# Patient Record
Sex: Female | Born: 1995 | Race: Black or African American | Hispanic: No | Marital: Single | Smoking: Never smoker
Health system: Southern US, Community
[De-identification: ages and names within clinical notes are randomized; demographics above are authoritative.]

## PROBLEM LIST (undated history)

## (undated) DIAGNOSIS — Z789 Other specified health status: Secondary | ICD-10-CM

## (undated) DIAGNOSIS — Z3490 Encounter for supervision of normal pregnancy, unspecified, unspecified trimester: Secondary | ICD-10-CM

## (undated) HISTORY — DX: Other specified health status: Z78.9

---

## 2017-08-01 ENCOUNTER — Emergency Department (HOSPITAL_BASED_OUTPATIENT_CLINIC_OR_DEPARTMENT_OTHER)
Admission: EM | Admit: 2017-08-01 | Discharge: 2017-08-01 | Disposition: A | Discharge status: Home / Self Care | Payer: BLUE CROSS/BLUE SHIELD | Attending: Emergency Medicine | Admitting: Emergency Medicine

## 2017-08-01 ENCOUNTER — Encounter (HOSPITAL_BASED_OUTPATIENT_CLINIC_OR_DEPARTMENT_OTHER): Payer: Self-pay | Admitting: Emergency Medicine

## 2017-08-01 ENCOUNTER — Other Ambulatory Visit: Payer: Self-pay

## 2017-08-01 DIAGNOSIS — T39395A Adverse effect of other nonsteroidal anti-inflammatory drugs [NSAID], initial encounter: Secondary | ICD-10-CM | POA: Diagnosis not present

## 2017-08-01 DIAGNOSIS — R42 Dizziness and giddiness: Secondary | ICD-10-CM | POA: Diagnosis not present

## 2017-08-01 DIAGNOSIS — K2960 Other gastritis without bleeding: Secondary | ICD-10-CM | POA: Diagnosis not present

## 2017-08-01 DIAGNOSIS — R1010 Upper abdominal pain, unspecified: Secondary | ICD-10-CM | POA: Diagnosis present

## 2017-08-01 LAB — COMPREHENSIVE METABOLIC PANEL
ALBUMIN: 3.6 g/dL (ref 3.5–5.0)
ALK PHOS: 57 U/L (ref 38–126)
ALT: 10 U/L (ref 0–44)
AST: 16 U/L (ref 15–41)
Anion gap: 7 (ref 5–15)
BILIRUBIN TOTAL: 1.9 mg/dL — AB (ref 0.3–1.2)
BUN: 18 mg/dL (ref 6–20)
CALCIUM: 8.9 mg/dL (ref 8.9–10.3)
CO2: 27 mmol/L (ref 22–32)
Chloride: 106 mmol/L (ref 98–111)
Creatinine, Ser: 0.84 mg/dL (ref 0.44–1.00)
GFR calc Af Amer: >60 mL/min (ref >60)
GFR calc non Af Amer: >60 mL/min (ref >60)
GLUCOSE: 89 mg/dL (ref 70–99)
POTASSIUM: 4 mmol/L (ref 3.5–5.1)
SODIUM: 140 mmol/L (ref 135–145)
Total Protein: 7 g/dL (ref 6.5–8.1)

## 2017-08-01 LAB — CBC WITH DIFFERENTIAL/PLATELET
BASOS ABS: 0 10*3/uL (ref 0.0–0.1)
Basophils Relative: 0 %
EOS ABS: 0.2 10*3/uL (ref 0.0–0.7)
Eosinophils Relative: 3 %
HEMATOCRIT: 32.1 % — AB (ref 36.0–46.0)
HEMOGLOBIN: 10.9 g/dL — AB (ref 12.0–15.0)
Lymphocytes Relative: 24 %
Lymphs Abs: 1.8 10*3/uL (ref 0.7–4.0)
MCH: 32.5 pg (ref 26.0–34.0)
MCHC: 34 g/dL (ref 30.0–36.0)
MCV: 95.8 fL (ref 78.0–100.0)
MONOS PCT: 7 %
Monocytes Absolute: 0.5 10*3/uL (ref 0.1–1.0)
NEUTROS ABS: 4.9 10*3/uL (ref 1.7–7.7)
NEUTROS PCT: 66 %
Platelets: 313 10*3/uL (ref 150–400)
RBC: 3.35 MIL/uL — ABNORMAL LOW (ref 3.87–5.11)
RDW: 10.2 % — ABNORMAL LOW (ref 11.5–15.5)
WBC: 7.4 10*3/uL (ref 4.0–10.5)

## 2017-08-01 LAB — URINALYSIS, ROUTINE W REFLEX MICROSCOPIC
Bilirubin Urine: NEGATIVE
Glucose, UA: NEGATIVE mg/dL
Hgb urine dipstick: NEGATIVE
KETONES UR: NEGATIVE mg/dL
Leukocytes, UA: NEGATIVE
NITRITE: NEGATIVE
Protein, ur: NEGATIVE mg/dL
Specific Gravity, Urine: <1.005 — ABNORMAL LOW (ref 1.005–1.030)
pH: 7 (ref 5.0–8.0)

## 2017-08-01 LAB — LIPASE, BLOOD: Lipase: 26 U/L (ref 11–51)

## 2017-08-01 LAB — PREGNANCY, URINE: PREG TEST UR: NEGATIVE

## 2017-08-01 MED ORDER — GI COCKTAIL ~~LOC~~
30.0000 mL | Freq: Once | ORAL | Status: AC
  Administered 2017-08-01: 30 mL via ORAL
  Filled 2017-08-01: qty 30

## 2017-08-01 MED ORDER — ONDANSETRON HCL 4 MG PO TABS: 4.0000 mg | ORAL_TABLET | Freq: Four times a day (QID) | ORAL | 0 refills | Status: DC | PRN

## 2017-08-01 MED ORDER — FAMOTIDINE 20 MG PO TABS: 20.0000 mg | ORAL_TABLET | Freq: Two times a day (BID) | ORAL | 0 refills | Status: DC

## 2017-08-01 NOTE — ED Provider Notes (Signed)
[facility] EMERGENCY DEPARTMENT Provider Note   CSN: [id number] Arrival date & time: 08/01/17  [id number]     History   Chief Complaint Chief Complaint  Patient presents with  . Nausea    HPI Cynthia Avery is a 22 y.o. female.  HPI Patient has been taking meloxicam for the past month for a hamstring injury.  Over the past week she is developed upper abdominal pain and nausea.  She also is experiencing lightheadedness especially with standing.  She denies vomiting.  No grossly bloody or melanotic stools.  No fever or chills.  No urinary symptoms.  Denies focal weakness or numbness. History reviewed. No pertinent past medical history.  There are no active problems to display for this patient.   History reviewed. No pertinent surgical history.   OB History   None      Home Medications    Prior to Admission medications   Medication Sig Start Date End Date Taking? Authorizing Provider  famotidine (PEPCID) 20 MG tablet Take 1 tablet (20 mg total) by mouth 2 (two) times daily. 08/01/17   Loren RacerYelverton, Malaijah Houchen, MD  ondansetron (ZOFRAN) 4 MG tablet Take 1 tablet (4 mg total) by mouth every 6 (six) hours as needed for nausea or vomiting. 08/01/17   Loren RacerYelverton, Willson Lipa, MD    Family History No family history on file.  Social History Social History   Tobacco Use  . Smoking status: Never Smoker  . Smokeless tobacco: Never Used  Substance Use Topics  . Alcohol use: Yes  . Drug use: Never     Allergies   Patient has no known allergies.   Review of Systems Review of Systems  Constitutional: Negative for chills and fever.  Eyes: Negative for visual disturbance.  Respiratory: Negative for cough and shortness of breath.   Cardiovascular: Negative for chest pain, palpitations and leg swelling.  Gastrointestinal: Positive for abdominal pain and nausea. Negative for blood in stool, constipation, diarrhea and vomiting.  Genitourinary: Negative for dysuria, flank pain and  hematuria.  Musculoskeletal: Negative for back pain, myalgias and neck pain.  Skin: Negative for rash and wound.  Neurological: Positive for light-headedness. Negative for syncope, weakness, numbness and headaches.  All other systems reviewed and are negative.    Physical Exam Updated Vital Signs BP 96/84   Pulse 60   Temp 98.3 F (36.8 C) (Oral)   Resp 16   Ht 5\' 4"  (1.626 m)   Wt 59 kg (130 lb)   LMP 07/27/2017   SpO2 99%   BMI 22.31 kg/m   Physical Exam  Constitutional: She is oriented to person, place, and time. She appears well-developed and well-nourished. No distress.  HENT:  Head: Normocephalic and atraumatic.  Mouth/Throat: Oropharynx is clear and moist. No oropharyngeal exudate.  Eyes: Pupils are equal, round, and reactive to light. EOM are normal.  Neck: Normal range of motion. Neck supple.  Cardiovascular: Normal rate and regular rhythm. Exam reveals no gallop and no friction rub.  No murmur heard. Pulmonary/Chest: Effort normal and breath sounds normal. No stridor. No respiratory distress. She has no wheezes. She has no rales. She exhibits no tenderness.  Abdominal: Soft. Bowel sounds are normal. There is tenderness. There is no rebound and no guarding.  Patient with epigastric and left upper quadrant tenderness to palpation.  No rebound or guarding.  Musculoskeletal: Normal range of motion. She exhibits no edema or tenderness.  No midline thoracic or lumbar tenderness.  No CVA tenderness.  No lower extremity swelling,  asymmetry or tenderness.  Distal pulses are 2+.  Neurological: She is alert and oriented to person, place, and time.  Moves all extremities without focal deficit.  Sensation intact.  Ambulance without difficulty.  Skin: Skin is warm and dry. Capillary refill takes less than 2 seconds. No rash noted. She is not diaphoretic. No erythema.  Psychiatric: She has a normal mood and affect. Her behavior is normal.  Nursing note and vitals  reviewed.    ED Treatments / Results  Labs (all labs ordered are listed, but only abnormal results are displayed) Labs Reviewed  URINALYSIS, ROUTINE W REFLEX MICROSCOPIC - Abnormal; Notable for the following components:      Result Value   Specific Gravity, Urine <1.005 (*)    All other components within normal limits  CBC WITH DIFFERENTIAL/PLATELET - Abnormal; Notable for the following components:   RBC 3.35 (*)    Hemoglobin 10.9 (*)    HCT 32.1 (*)    RDW 10.2 (*)    All other components within normal limits  COMPREHENSIVE METABOLIC PANEL - Abnormal; Notable for the following components:   Total Bilirubin 1.9 (*)    All other components within normal limits  PREGNANCY, URINE  LIPASE, BLOOD    EKG EKG Interpretation  Date/Time:  Thursday August 01 2017 10:07:21 EDT Ventricular Rate:  60 PR Interval:    QRS Duration: 97 QT Interval:  443 QTC Calculation: 443 R Axis:   64 Text Interpretation:  Sinus rhythm Confirmed by Loren Racer ([id number]) on 08/01/2017 11:14:26 AM   Radiology No results found.  Procedures Procedures (including critical care time)  Medications Ordered in ED Medications  gi cocktail (Maalox,Lidocaine,Donnatal) (30 mLs Oral Given 08/01/17 0955)     Initial Impression / Assessment and Plan / ED Course  I have reviewed the triage vital signs and the nursing notes.  Pertinent labs & imaging results that were available during my care of the patient were reviewed by me and considered in my medical decision making (see chart for details).    Abdominal pain is improved after GI cocktail.  Labs are reassuring.  Likely NSAID induced gastritis.  Advised to avoid meloxicam and all NSAIDs.  May take Tylenol as needed for pain.  Will start on Pepcid.  Advised to follow-up with her gastroneurologist if her symptoms persist.  Return precautions given.   Final Clinical Impressions(s) / ED Diagnoses   Final diagnoses:  NSAID induced gastritis   Lightheadedness    ED Discharge Orders        Ordered    famotidine (PEPCID) 20 MG tablet  2 times daily     08/01/17 1113    ondansetron (ZOFRAN) 4 MG tablet  Every 6 hours PRN     08/01/17 1113       Loren Racer, MD 08/01/17 1114

## 2017-08-01 NOTE — ED Triage Notes (Signed)
Pt c/o nausea x 1 week, denies vomiting. Reports mild dizziness. States possibly related to starting meloxicam for hamstring injury.

## 2017-08-01 NOTE — Discharge Instructions (Addendum)
Stop taking meloxicam.  Avoid all NSAIDs.  Take medication as prescribed.  If your symptoms persist, follow-up with the gastroenterologist.

## 2020-06-07 DIAGNOSIS — B009 Herpesviral infection, unspecified: Secondary | ICD-10-CM

## 2020-07-21 ENCOUNTER — Encounter: Payer: Self-pay | Admitting: Family Medicine

## 2020-07-21 ENCOUNTER — Ambulatory Visit (INDEPENDENT_AMBULATORY_CARE_PROVIDER_SITE_OTHER): Payer: 59 | Admitting: Family Medicine

## 2020-07-21 ENCOUNTER — Other Ambulatory Visit: Payer: Self-pay

## 2020-07-21 NOTE — Progress Notes (Signed)
    Post Partum Visit Note  Cynthia Avery is a 25 y.o. No obstetric history on file. female who presents for a postpartum visit. She is 6 week postpartum following a normal spontaneous vaginal delivery.  I have fully reviewed the prenatal and intrapartum course. The delivery was at 40.5 gestational weeks.  Anesthesia: none. Postpartum course has been uncomplicated. Baby is doing well. Baby is feeding by breast. Bleeding no bleeding. Bowel function is normal. Bladder function is normal. Patient is not sexually active. Contraception method is none. Postpartum depression screening: negative.   The pregnancy intention screening data noted above was reviewed. Potential methods of contraception were discussed. The patient elected to proceed with No data recorded.    Health Maintenance Due  Topic Date Due   COVID-19 Vaccine (1) Never done   HPV VACCINES (1 - 2-dose series) Never done   HIV Screening  Never done   Hepatitis C Screening  Never done   TETANUS/TDAP  Never done   PAP-Cervical Cytology Screening  Never done   PAP SMEAR-Modifier  Never done    The following portions of the patient's history were reviewed and updated as appropriate: allergies, current medications, past family history, past medical history, past social history, past surgical history, and problem list.  Review of Systems Pertinent items are noted in HPI.  Objective:  BP 106/70   Pulse 72   Wt 120 lb (54.4 kg)   BMI 20.60 kg/m    General:  alert, cooperative, and no distress  Lungs: clear to auscultation bilaterally  Heart:  regular rate and rhythm, S1, S2 normal, no murmur, click, rub or gallop  Abdomen: soft, non-tender; bowel sounds normal; no masses,  no organomegaly   GU exam:  normal       Assessment:   1. Postpartum exam      Plan:   Essential components of care per ACOG recommendations:  1.  Mood and well being: Patient with negative depression screening today. Reviewed local resources for  support.  - Patient tobacco use? No.   - hx of drug use? No.    2. Infant care and feeding:  -Patient currently breastmilk feeding?  yes -Social determinants of health (SDOH) reviewed in EPIC. No concerns  3. Sexuality, contraception and birth spacing - Patient does not want a pregnancy in the next year.  Desired family size is 1 children.  - Reviewed forms of contraception in tiered fashion. Patient desired no method today.   - Discussed birth spacing of 18 months  4. Sleep and fatigue -Encouraged family/partner/community support of 4 hrs of uninterrupted sleep to help with mood and fatigue  5. Physical Recovery  - Discussed patients delivery and complications. She describes her labor as good. - Patient had a Vaginal, no problems at delivery. Patient had a 1st degree laceration. Perineal healing reviewed. Patient expressed understanding - Patient has urinary incontinence? No. - Patient is safe to resume physical and sexual activity  6.  Health Maintenance - HM due items addressed  - Last pap smear 10/2019 Pap smear not done at today's visit.  -Breast Cancer screening indicated? No.   7. Chronic Disease/Pregnancy Condition follow up: None  - PCP follow up  Levie Heritage, DO Center for [facility], [facility]

## 2020-07-26 ENCOUNTER — Emergency Department (HOSPITAL_BASED_OUTPATIENT_CLINIC_OR_DEPARTMENT_OTHER): Payer: 59

## 2020-07-26 ENCOUNTER — Encounter (HOSPITAL_BASED_OUTPATIENT_CLINIC_OR_DEPARTMENT_OTHER): Payer: Self-pay

## 2020-07-26 ENCOUNTER — Other Ambulatory Visit: Payer: Self-pay

## 2020-07-26 ENCOUNTER — Emergency Department (HOSPITAL_BASED_OUTPATIENT_CLINIC_OR_DEPARTMENT_OTHER)
Admission: EM | Admit: 2020-07-26 | Discharge: 2020-07-26 | Disposition: A | Discharge status: Home / Self Care | Payer: 59 | Attending: Emergency Medicine | Admitting: Emergency Medicine

## 2020-07-26 DIAGNOSIS — R079 Chest pain, unspecified: Secondary | ICD-10-CM

## 2020-07-26 DIAGNOSIS — R072 Precordial pain: Secondary | ICD-10-CM | POA: Diagnosis present

## 2020-07-26 DIAGNOSIS — K219 Gastro-esophageal reflux disease without esophagitis: Secondary | ICD-10-CM

## 2020-07-26 LAB — TROPONIN I (HIGH SENSITIVITY)
Troponin I (High Sensitivity): <2 ng/L (ref <18)
Troponin I (High Sensitivity): <2 ng/L (ref <18)

## 2020-07-26 LAB — CBC
HCT: 37.1 % (ref 36.0–46.0)
Hemoglobin: 12.2 g/dL (ref 12.0–15.0)
MCH: 31.5 pg (ref 26.0–34.0)
MCHC: 32.9 g/dL (ref 30.0–36.0)
MCV: 95.9 fL (ref 80.0–100.0)
Platelets: 285 10*3/uL (ref 150–400)
RBC: 3.87 MIL/uL (ref 3.87–5.11)
RDW: 11 % — ABNORMAL LOW (ref 11.5–15.5)
WBC: 5.2 10*3/uL (ref 4.0–10.5)
nRBC: 0 % (ref 0.0–0.2)

## 2020-07-26 LAB — BASIC METABOLIC PANEL
Anion gap: 11 (ref 5–15)
BUN: 11 mg/dL (ref 6–20)
CO2: 27 mmol/L (ref 22–32)
Calcium: 10.1 mg/dL (ref 8.9–10.3)
Chloride: 102 mmol/L (ref 98–111)
Creatinine, Ser: 0.92 mg/dL (ref 0.44–1.00)
GFR, Estimated: >60 mL/min (ref >60)
Glucose, Bld: 77 mg/dL (ref 70–99)
Potassium: 3.5 mmol/L (ref 3.5–5.1)
Sodium: 140 mmol/L (ref 135–145)

## 2020-07-26 LAB — PREGNANCY, URINE: Preg Test, Ur: NEGATIVE

## 2020-07-26 LAB — D-DIMER, QUANTITATIVE: D-Dimer, Quant: 0.48 ug/mL-FEU (ref 0.00–0.50)

## 2020-07-26 MED ORDER — FAMOTIDINE 20 MG PO TABS: 20.0000 mg | ORAL_TABLET | Freq: Two times a day (BID) | ORAL | 0 refills | Status: DC

## 2020-07-26 NOTE — ED Triage Notes (Addendum)
Pt c/o CP 2 weeks ago that lasted for ~5 min-CP again x ~10 min last night-denies CP at present-states that tums relieved pain last night-denies fever/flu sx-NAD-steady gait

## 2020-07-26 NOTE — Discharge Instructions (Addendum)
Your workup was overall reassuring at this time. Your symptoms may be related to acid reflux. Please pick up medication and take daily.   Follow up with your PCP regarding ED visit today  Return to the ED for any new/worsening symptoms

## 2020-07-26 NOTE — ED Provider Notes (Signed)
[facility] EMERGENCY DEPARTMENT Provider Note   CSN: [id number] Arrival date & time: 07/26/20  1130     History Chief Complaint  Patient presents with   Chest Pain    Cynthia Avery is a 25 y.o. female who presents to the ED today with complaint of sudden onset, intermittent, substernal chest pain x 2 weeks.  Patient reports that 2 weeks ago she was woken up out of her sleep around 2 AM with substernal chest pain/heaviness.  She states that it lasted approximately 5 minutes before dissipating.  She did not think much of it however last night she had similar symptoms.  She states that both times she ate several hours prior to this happening.  She did used to take famotidine for acid reflux however has not taken it in several years.  She states this feels different.  She denies any shortness of breath, diaphoresis, nausea, vomiting with same.  She notes that she just gave birth to her child approximately 6 weeks ago.  She is not on oral birth control pills at this time.  She denies any family history of CAD or sudden cardiac death.  She states otherwise she is a healthy female who regularly exercises and has never had chest pain with same.  She vapes, never smoker.  No other complaints at this time.  The history is provided by the patient and medical records.      History reviewed. No pertinent past medical history.  There are no problems to display for this patient.   History reviewed. No pertinent surgical history.   OB History   No obstetric history on file.     No family history on file.  Social History   Tobacco Use   Smoking status: Never   Smokeless tobacco: Never  Vaping Use   Vaping Use: Every day  Substance Use Topics   Alcohol use: Yes    Comment: occ   Drug use: Never    Home Medications Prior to Admission medications   Medication Sig Start Date End Date Taking? Authorizing Provider  famotidine (PEPCID) 20 MG tablet Take 1 tablet (20 mg total) by  mouth 2 (two) times daily. 07/26/20 08/25/20  Tanda Rockers, PA-C  ondansetron (ZOFRAN) 4 MG tablet Take 1 tablet (4 mg total) by mouth every 6 (six) hours as needed for nausea or vomiting. 08/01/17   Loren Racer, MD    Allergies    Patient has no known allergies.  Review of Systems   Review of Systems  Constitutional:  Negative for chills and fever.  Respiratory:  Negative for shortness of breath.   Cardiovascular:  Positive for chest pain.  Gastrointestinal:  Negative for nausea and vomiting.  All other systems reviewed and are negative.  Physical Exam Updated Vital Signs BP 114/76   Pulse 60   Temp 98.3 F (36.8 C) (Oral)   Resp (!) 23   Ht 5\' 4"  (1.626 m)   Wt 54.4 kg   LMP 07/20/2020 (Approximate)   SpO2 100%   BMI 20.60 kg/m   Physical Exam Vitals and nursing note reviewed.  Constitutional:      Appearance: She is not ill-appearing or diaphoretic.  HENT:     Head: Normocephalic and atraumatic.  Eyes:     Conjunctiva/sclera: Conjunctivae normal.  Cardiovascular:     Rate and Rhythm: Normal rate and regular rhythm.     Pulses:          Radial pulses are 2+ on the right  side and 2+ on the left side.     Heart sounds: Normal heart sounds.  Pulmonary:     Effort: Pulmonary effort is normal.     Breath sounds: Normal breath sounds. No decreased breath sounds, wheezing or rales.  Chest:     Chest wall: No tenderness.  Abdominal:     Palpations: Abdomen is soft.     Tenderness: There is no abdominal tenderness. There is no guarding or rebound.  Musculoskeletal:     Cervical back: Neck supple.  Skin:    General: Skin is warm and dry.  Neurological:     Mental Status: She is alert.    ED Results / Procedures / Treatments   Labs (all labs ordered are listed, but only abnormal results are displayed) Labs Reviewed  CBC - Abnormal; Notable for the following components:      Result Value   RDW 11.0 (*)    All other components within normal limits  BASIC  METABOLIC PANEL  PREGNANCY, URINE  D-DIMER, QUANTITATIVE  TROPONIN I (HIGH SENSITIVITY)  TROPONIN I (HIGH SENSITIVITY)    EKG EKG Interpretation  Date/Time:  Tuesday July 26 2020 11:38:21 EDT Ventricular Rate:  71 PR Interval:  112 QRS Duration: 72 QT Interval:  392 QTC Calculation: 425 R Axis:   61 Text Interpretation: Normal sinus rhythm Normal ECG  Nonspecific ECG changes Confirmed by Alvira Monday ([id number]) on 07/26/2020 1:38:55 PM  Radiology DG Chest 2 View  Result Date: 07/26/2020 CLINICAL DATA:  Chest pain. EXAM: CHEST - 2 VIEW COMPARISON:  None. FINDINGS: The heart size and mediastinal contours are within normal limits. Both lungs are clear. The visualized skeletal structures are unremarkable. IMPRESSION: No active cardiopulmonary disease. Electronically Signed   By: Kennith Center M.D.   On: 07/26/2020 12:42    Procedures Procedures   Medications Ordered in ED Medications - No data to display  ED Course  I have reviewed the triage vital signs and the nursing notes.  Pertinent labs & imaging results that were available during my care of the patient were reviewed by me and considered in my medical decision making (see chart for details).  Clinical Course as of 07/26/20 1455  Tue Jul 26, 2020  1429 D-Dimer, Quant: 0.48 [MV]    Clinical Course User Index [MV] Tanda Rockers, PA-C   MDM Rules/Calculators/A&P                          25 year old female who presents to the ED today with complaint of 2 episodes of sudden onset substernal chest pain that began 2 weeks ago.  Had 1 episode 2 weeks ago and another episode yesterday.  No other associated symptoms.  She did take Tums last night which relieved her symptoms.  She used to take famotidine in the past however has not taken in a couple of years and states this feels different than acid reflux.  She did just give birth to her child 6 weeks ago.  No other complications.  On arrival to the ED today vitals are stable.   Patient appears to be no acute distress.  No active chest pain.  EKG does show some nonspecific changes.  Chest x-ray clear.  Initial work-up started with troponin less than 2.  Suspicion for ACS at this time.  Patient does vape, never smoker and denies any family history of CAD or sudden cardiac death.  There is concern given patient just had a child 6  weeks ago.  Question PE.  We will plan for D-dimer at this time.  Question also acid reflux.  Given patient is not having any chest pain at this time will hold off on treatment  D dimer negative Repeat troponin <2 Suspect GERD. Will discharge with Rx famotidine and PCP follow up. Stable for discharge at this time.   This note was prepared using Dragon voice recognition software and may include unintentional dictation errors due to the inherent limitations of voice recognition software.    Final Clinical Impression(s) / ED Diagnoses Final diagnoses:  Nonspecific chest pain  Gastroesophageal reflux disease without esophagitis    Rx / DC Orders ED Discharge Orders          Ordered    famotidine (PEPCID) 20 MG tablet  2 times daily        07/26/20 1455             Discharge Instructions      Your workup was overall reassuring at this time. Your symptoms may be related to acid reflux. Please pick up medication and take daily.   Follow up with your PCP regarding ED visit today  Return to the ED for any new/worsening symptoms       Tanda Rockers, PA-C 07/26/20 1455    Alvira Monday, MD 07/27/20 2212

## 2020-08-26 ENCOUNTER — Encounter (HOSPITAL_BASED_OUTPATIENT_CLINIC_OR_DEPARTMENT_OTHER): Payer: Self-pay | Admitting: *Deleted

## 2020-08-26 ENCOUNTER — Inpatient Hospital Stay (HOSPITAL_BASED_OUTPATIENT_CLINIC_OR_DEPARTMENT_OTHER)
Admission: EM | Admit: 2020-08-26 | Discharge: 2020-08-31 | DRG: 417 | Disposition: A | Discharge status: Home / Self Care | Payer: 59 | Attending: Internal Medicine | Admitting: Internal Medicine

## 2020-08-26 ENCOUNTER — Other Ambulatory Visit: Payer: Self-pay

## 2020-08-26 ENCOUNTER — Emergency Department (HOSPITAL_BASED_OUTPATIENT_CLINIC_OR_DEPARTMENT_OTHER): Payer: 59

## 2020-08-26 DIAGNOSIS — R7989 Other specified abnormal findings of blood chemistry: Secondary | ICD-10-CM

## 2020-08-26 DIAGNOSIS — Z20822 Contact with and (suspected) exposure to covid-19: Secondary | ICD-10-CM | POA: Diagnosis present

## 2020-08-26 DIAGNOSIS — K859 Acute pancreatitis without necrosis or infection, unspecified: Secondary | ICD-10-CM | POA: Diagnosis present

## 2020-08-26 DIAGNOSIS — E876 Hypokalemia: Secondary | ICD-10-CM | POA: Diagnosis present

## 2020-08-26 DIAGNOSIS — K8050 Calculus of bile duct without cholangitis or cholecystitis without obstruction: Secondary | ICD-10-CM

## 2020-08-26 DIAGNOSIS — K8070 Calculus of gallbladder and bile duct without cholecystitis without obstruction: Secondary | ICD-10-CM | POA: Diagnosis not present

## 2020-08-26 DIAGNOSIS — R1011 Right upper quadrant pain: Secondary | ICD-10-CM

## 2020-08-26 DIAGNOSIS — K828 Other specified diseases of gallbladder: Secondary | ICD-10-CM | POA: Diagnosis present

## 2020-08-26 DIAGNOSIS — R17 Unspecified jaundice: Secondary | ICD-10-CM

## 2020-08-26 DIAGNOSIS — R188 Other ascites: Secondary | ICD-10-CM | POA: Diagnosis present

## 2020-08-26 DIAGNOSIS — K219 Gastro-esophageal reflux disease without esophagitis: Secondary | ICD-10-CM | POA: Diagnosis present

## 2020-08-26 DIAGNOSIS — R109 Unspecified abdominal pain: Secondary | ICD-10-CM | POA: Diagnosis present

## 2020-08-26 DIAGNOSIS — K5900 Constipation, unspecified: Secondary | ICD-10-CM | POA: Diagnosis present

## 2020-08-26 HISTORY — DX: Encounter for supervision of normal pregnancy, unspecified, unspecified trimester: Z34.90

## 2020-08-26 LAB — TROPONIN I (HIGH SENSITIVITY): Troponin I (High Sensitivity): <2 ng/L (ref <18)

## 2020-08-26 LAB — COMPREHENSIVE METABOLIC PANEL
ALT: 76 U/L — ABNORMAL HIGH (ref 0–44)
AST: 219 U/L — ABNORMAL HIGH (ref 15–41)
Albumin: 4.4 g/dL (ref 3.5–5.0)
Alkaline Phosphatase: 86 U/L (ref 38–126)
Anion gap: 8 (ref 5–15)
BUN: 13 mg/dL (ref 6–20)
CO2: 28 mmol/L (ref 22–32)
Calcium: 9.2 mg/dL (ref 8.9–10.3)
Chloride: 101 mmol/L (ref 98–111)
Creatinine, Ser: 0.8 mg/dL (ref 0.44–1.00)
GFR, Estimated: >60 mL/min (ref >60)
Glucose, Bld: 92 mg/dL (ref 70–99)
Potassium: 3.4 mmol/L — ABNORMAL LOW (ref 3.5–5.1)
Sodium: 137 mmol/L (ref 135–145)
Total Bilirubin: 3.8 mg/dL — ABNORMAL HIGH (ref 0.3–1.2)
Total Protein: 8 g/dL (ref 6.5–8.1)

## 2020-08-26 LAB — CBC WITH DIFFERENTIAL/PLATELET
Abs Immature Granulocytes: 0.04 10*3/uL (ref 0.00–0.07)
Basophils Absolute: 0 10*3/uL (ref 0.0–0.1)
Basophils Relative: 1 %
Eosinophils Absolute: 0 10*3/uL (ref 0.0–0.5)
Eosinophils Relative: 0 %
HCT: 36.9 % (ref 36.0–46.0)
Hemoglobin: 12.2 g/dL (ref 12.0–15.0)
Immature Granulocytes: 1 %
Lymphocytes Relative: 8 %
Lymphs Abs: 0.7 10*3/uL (ref 0.7–4.0)
MCH: 31.6 pg (ref 26.0–34.0)
MCHC: 33.1 g/dL (ref 30.0–36.0)
MCV: 95.6 fL (ref 80.0–100.0)
Monocytes Absolute: 0.6 10*3/uL (ref 0.1–1.0)
Monocytes Relative: 7 %
Neutro Abs: 7.4 10*3/uL (ref 1.7–7.7)
Neutrophils Relative %: 83 %
Platelets: 331 10*3/uL (ref 150–400)
RBC: 3.86 MIL/uL — ABNORMAL LOW (ref 3.87–5.11)
RDW: 11 % — ABNORMAL LOW (ref 11.5–15.5)
WBC: 8.9 10*3/uL (ref 4.0–10.5)
nRBC: 0 % (ref 0.0–0.2)

## 2020-08-26 LAB — LIPASE, BLOOD: Lipase: 36 U/L (ref 11–51)

## 2020-08-26 NOTE — ED Triage Notes (Signed)
Epigastric pain into her back for an hour. States she has been here before for same and told her has GERD.

## 2020-08-26 NOTE — ED Provider Notes (Addendum)
[facility] EMERGENCY DEPARTMENT Provider Note   CSN: [id number] Arrival date & time: 08/26/20  2148     History Chief Complaint  Patient presents with   Chest Pain    Cynthia Avery is a 25 y.o. female.  The history is provided by the patient.  Chest Pain Chest pain location: RUQ and epigastric area. Pain quality: aching   Pain radiates to:  Does not radiate Pain severity:  Moderate Onset quality:  Sudden Duration: hours. Timing:  Constant Progression:  Unchanged Chronicity:  New Context: at rest   Context comment:  After eating soup and a bag of Doritos Relieved by:  Nothing Worsened by:  Nothing Ineffective treatments:  None tried Associated symptoms: abdominal pain   Associated symptoms: no fever, no lower extremity edema and no shortness of breath   Risk factors: not obese       History reviewed. No pertinent past medical history.  There are no problems to display for this patient.   History reviewed. No pertinent surgical history.   OB History   No obstetric history on file.     History reviewed. No pertinent family history.  Social History   Tobacco Use   Smoking status: Never   Smokeless tobacco: Never  Vaping Use   Vaping Use: Every day  Substance Use Topics   Alcohol use: Yes    Comment: occ   Drug use: Never    Home Medications Prior to Admission medications   Medication Sig Start Date End Date Taking? Authorizing Provider  famotidine (PEPCID) 20 MG tablet Take 1 tablet (20 mg total) by mouth 2 (two) times daily. 07/26/20 08/25/20  Tanda Rockers, PA-C  ondansetron (ZOFRAN) 4 MG tablet Take 1 tablet (4 mg total) by mouth every 6 (six) hours as needed for nausea or vomiting. 08/01/17   Loren Racer, MD    Allergies    Patient has no known allergies.  Review of Systems   Review of Systems  Constitutional:  Negative for fever.  HENT:  Negative for facial swelling.   Eyes:  Negative for redness.  Respiratory:  Negative for  shortness of breath.   Cardiovascular:  Positive for chest pain.  Gastrointestinal:  Positive for abdominal pain.  Genitourinary:  Negative for difficulty urinating.  Musculoskeletal:  Negative for neck stiffness.  Skin:  Negative for rash.  Neurological:  Negative for facial asymmetry.  Psychiatric/Behavioral:  Negative for agitation.   All other systems reviewed and are negative.  Physical Exam Updated Vital Signs BP 104/64 (BP Location: Right Arm)   Pulse 80   Temp 98.3 F (36.8 C) (Oral)   Resp 20   Ht 5\' 4"  (1.626 m)   Wt 54.4 kg   LMP 07/16/2020 Comment: recent preg, gave birth 06/07/20  SpO2 100%   BMI 20.59 kg/m   Physical Exam Vitals and nursing note reviewed.  Constitutional:      General: She is not in acute distress.    Appearance: Normal appearance.  HENT:     Head: Normocephalic and atraumatic.     Nose: Nose normal.  Eyes:     Conjunctiva/sclera: Conjunctivae normal.     Pupils: Pupils are equal, round, and reactive to light.  Cardiovascular:     Rate and Rhythm: Normal rate and regular rhythm.     Pulses: Normal pulses.     Heart sounds: Normal heart sounds.  Pulmonary:     Effort: Pulmonary effort is normal.     Breath sounds: Normal breath sounds.  Abdominal:     General: Abdomen is flat.     Palpations: Abdomen is soft.     Tenderness: There is abdominal tenderness. There is no rebound.     Comments: RUQ   Musculoskeletal:        General: Normal range of motion.     Cervical back: Normal range of motion and neck supple.  Skin:    General: Skin is warm and dry.     Capillary Refill: Capillary refill takes less than 2 seconds.  Neurological:     General: No focal deficit present.     Mental Status: She is alert and oriented to person, place, and time.  Psychiatric:        Mood and Affect: Mood normal.        Behavior: Behavior normal.    ED Results / Procedures / Treatments   Labs (all labs ordered are listed, but only abnormal results  are displayed) Results for orders placed or performed during the hospital encounter of 08/26/20  CBC with Differential/Platelet  Result Value Ref Range   WBC 8.9 4.0 - 10.5 K/uL   RBC 3.86 (L) 3.87 - 5.11 MIL/uL   Hemoglobin 12.2 12.0 - 15.0 g/dL   HCT 31.5 17.6 - 16.0 %   MCV 95.6 80.0 - 100.0 fL   MCH 31.6 26.0 - 34.0 pg   MCHC 33.1 30.0 - 36.0 g/dL   RDW 73.7 (L) 10.6 - 26.9 %   Platelets 331 150 - 400 K/uL   nRBC 0.0 0.0 - 0.2 %   Neutrophils Relative % 83 %   Neutro Abs 7.4 1.7 - 7.7 K/uL   Lymphocytes Relative 8 %   Lymphs Abs 0.7 0.7 - 4.0 K/uL   Monocytes Relative 7 %   Monocytes Absolute 0.6 0.1 - 1.0 K/uL   Eosinophils Relative 0 %   Eosinophils Absolute 0.0 0.0 - 0.5 K/uL   Basophils Relative 1 %   Basophils Absolute 0.0 0.0 - 0.1 K/uL   Immature Granulocytes 1 %   Abs Immature Granulocytes 0.04 0.00 - 0.07 K/uL  Comprehensive metabolic panel  Result Value Ref Range   Sodium 137 135 - 145 mmol/L   Potassium 3.4 (L) 3.5 - 5.1 mmol/L   Chloride 101 98 - 111 mmol/L   CO2 28 22 - 32 mmol/L   Glucose, Bld 92 70 - 99 mg/dL   BUN 13 6 - 20 mg/dL   Creatinine, Ser 4.85 0.44 - 1.00 mg/dL   Calcium 9.2 8.9 - 46.2 mg/dL   Total Protein 8.0 6.5 - 8.1 g/dL   Albumin 4.4 3.5 - 5.0 g/dL   AST 703 (H) 15 - 41 U/L   ALT 76 (H) 0 - 44 U/L   Alkaline Phosphatase 86 38 - 126 U/L   Total Bilirubin 3.8 (H) 0.3 - 1.2 mg/dL   GFR, Estimated >50 >09 mL/min   Anion gap 8 5 - 15  Lipase, blood  Result Value Ref Range   Lipase 36 11 - 51 U/L  Troponin I (High Sensitivity)  Result Value Ref Range   Troponin I (High Sensitivity) <2 <18 ng/L   DG Chest 2 View  Result Date: 08/26/2020 CLINICAL DATA:  Chest pain EXAM: CHEST - 2 VIEW COMPARISON:  None. FINDINGS: The heart size and mediastinal contours are within normal limits. Both lungs are clear. The visualized skeletal structures are unremarkable. IMPRESSION: No active cardiopulmonary disease. Electronically Signed   By: Helyn Numbers M.D.   On:  08/26/2020 22:47    EKG EKG Interpretation  Date/Time:  Friday August 26 2020 22:11:05 EDT Ventricular Rate:  77 PR Interval:  128 QRS Duration: 74 QT Interval:  386 QTC Calculation: 436 R Axis:   72 Text Interpretation: Normal sinus rhythm Nonspecific T wave abnormality no change Confirmed by Nicanor Alcon, Sevrin Sally ([id number]) on 08/26/2020 11:19:36 PM  Radiology DG Chest 2 View  Result Date: 08/26/2020 CLINICAL DATA:  Chest pain EXAM: CHEST - 2 VIEW COMPARISON:  None. FINDINGS: The heart size and mediastinal contours are within normal limits. Both lungs are clear. The visualized skeletal structures are unremarkable. IMPRESSION: No active cardiopulmonary disease. Electronically Signed   By: Helyn Numbers M.D.   On: 08/26/2020 22:47    Procedures Procedures   Medications Ordered in ED Medications - No data to display  ED Course  I have reviewed the triage vital signs and the nursing notes.  Pertinent labs & imaging results that were available during my care of the patient were reviewed by me and considered in my medical decision making (see chart for details).    Transfer to [facility] for US of the GB.  RUQ pain with elevated LFTs. I do not believe this is cardiac.  PERC negative wells 0 highly doubt PE.  I believe this is biliary colic.    Accepted by [facility] Final Clinical Impression(s) / ED Diagnoses Final diagnoses:  None  Transfer POV to [facility]   Rx / DC Orders ED Discharge Orders     None        Bretta Fees, MD 08/26/20 2359    Littie Chiem, MD 08/27/20 0001

## 2020-08-27 ENCOUNTER — Observation Stay (HOSPITAL_COMMUNITY): Payer: 59

## 2020-08-27 ENCOUNTER — Emergency Department (HOSPITAL_COMMUNITY): Payer: 59

## 2020-08-27 ENCOUNTER — Encounter (HOSPITAL_COMMUNITY): Payer: Self-pay

## 2020-08-27 DIAGNOSIS — E876 Hypokalemia: Secondary | ICD-10-CM

## 2020-08-27 DIAGNOSIS — K8050 Calculus of bile duct without cholangitis or cholecystitis without obstruction: Secondary | ICD-10-CM

## 2020-08-27 LAB — HIV ANTIBODY (ROUTINE TESTING W REFLEX): HIV Screen 4th Generation wRfx: NONREACTIVE

## 2020-08-27 LAB — I-STAT BETA HCG BLOOD, ED (MC, WL, AP ONLY): I-stat hCG, quantitative: <5 m[IU]/mL (ref <5)

## 2020-08-27 LAB — RESP PANEL BY RT-PCR (FLU A&B, COVID) ARPGX2
Influenza A by PCR: NEGATIVE
Influenza B by PCR: NEGATIVE
SARS Coronavirus 2 by RT PCR: NEGATIVE

## 2020-08-27 LAB — HEPATIC FUNCTION PANEL
ALT: 453 U/L — ABNORMAL HIGH (ref 0–44)
AST: 616 U/L — ABNORMAL HIGH (ref 15–41)
Albumin: 4.1 g/dL (ref 3.5–5.0)
Alkaline Phosphatase: 124 U/L (ref 38–126)
Bilirubin, Direct: 2.8 mg/dL — ABNORMAL HIGH (ref 0.0–0.2)
Indirect Bilirubin: 3.6 mg/dL — ABNORMAL HIGH (ref 0.3–0.9)
Total Bilirubin: 6.4 mg/dL — ABNORMAL HIGH (ref 0.3–1.2)
Total Protein: 7.6 g/dL (ref 6.5–8.1)

## 2020-08-27 LAB — TROPONIN I (HIGH SENSITIVITY): Troponin I (High Sensitivity): <2 ng/L (ref <18)

## 2020-08-27 LAB — MAGNESIUM: Magnesium: 2.1 mg/dL (ref 1.7–2.4)

## 2020-08-27 MED ORDER — SENNA 8.6 MG PO TABS
2.0000 | ORAL_TABLET | Freq: Every day | ORAL | Status: DC
  Administered 2020-08-27 – 2020-08-31 (×4): 17.2 mg via ORAL
  Filled 2020-08-27 (×4): qty 2

## 2020-08-27 MED ORDER — IOHEXOL 350 MG/ML SOLN
80.0000 mL | Freq: Once | INTRAVENOUS | Status: AC | PRN
  Administered 2020-08-27: 80 mL via INTRAVENOUS

## 2020-08-27 MED ORDER — GADOBUTROL 1 MMOL/ML IV SOLN
6.0000 mL | Freq: Once | INTRAVENOUS | Status: AC | PRN
  Administered 2020-08-27: 6 mL via INTRAVENOUS

## 2020-08-27 MED ORDER — FENTANYL CITRATE (PF) 100 MCG/2ML IJ SOLN
50.0000 ug | Freq: Once | INTRAMUSCULAR | Status: AC
  Administered 2020-08-27: 50 ug via INTRAVENOUS
  Filled 2020-08-27: qty 2

## 2020-08-27 MED ORDER — POTASSIUM CHLORIDE 10 MEQ/100ML IV SOLN
10.0000 meq | INTRAVENOUS | Status: AC
  Administered 2020-08-27: 10 meq via INTRAVENOUS
  Filled 2020-08-27 (×2): qty 100

## 2020-08-27 MED ORDER — POTASSIUM CHLORIDE 10 MEQ/100ML IV SOLN
10.0000 meq | Freq: Once | INTRAVENOUS | Status: AC
  Administered 2020-08-27: 10 meq via INTRAVENOUS

## 2020-08-27 MED ORDER — OXYCODONE-ACETAMINOPHEN 5-325 MG PO TABS
1.0000 | ORAL_TABLET | Freq: Once | ORAL | Status: AC
Start: 2020-08-27 — End: 2020-08-27
  Administered 2020-08-27: 1 via ORAL
  Filled 2020-08-27: qty 1

## 2020-08-27 MED ORDER — MORPHINE SULFATE (PF) 2 MG/ML IV SOLN
1.0000 mg | INTRAVENOUS | Status: DC | PRN
  Administered 2020-08-28 – 2020-08-29 (×3): 1 mg via INTRAVENOUS
  Filled 2020-08-27 (×3): qty 1

## 2020-08-27 MED ORDER — POLYETHYLENE GLYCOL 3350 17 G PO PACK
17.0000 g | PACK | Freq: Every day | ORAL | Status: DC
  Administered 2020-08-27 – 2020-08-31 (×3): 17 g via ORAL
  Filled 2020-08-27 (×3): qty 1

## 2020-08-27 NOTE — Consult Note (Signed)
Referring Provider:  [facility] Primary Care Physician:  Pcp, No Primary Gastroenterologist: Gentry Fitz  Reason for Consultation: Abdominal pain, biliary dilation  HPI: Cynthia Avery is a 25 y.o. female with no significant past medical history presented to the hospital yesterday with epigastric and right upper quadrant abdominal pain.    Upon initial evaluation in the emergency room she was found to have a normal CBC, mildly elevated LFTs with AST of 219, ALT 76 , normal alkaline phosphatase and T bili of 3.8.  Normal lipase.  Ultrasound right upper quadrant showed gallbladder sludge with mild dilated CBD at 9 mm.  No evidence of acute cholecystitis.  CT abdomen pelvis with contrast showed distended gallbladder with periportal edema and mild dilation of extrahepatic duct at 8 to 9 mm.  GI is consulted for further evaluation.  Patient seen and examined at bedside.  She had 3 episodes of similar abdominal pain since June 2022.  Described as epigastric and right upper quadrant abdominal pain which is random in occurrence.  Occasional nausea.  Denies any vomiting.  Denies any diarrhea or constipation.  Denies any blood in the stool or black stool.  Denies fever or chills.  Abdominal pain has resolved now.    Past Medical History:  Diagnosis Date   Pregnancy     History reviewed. No pertinent surgical history.  Prior to Admission medications   Medication Sig Start Date End Date Taking? Authorizing Provider  ibuprofen (ADVIL) 200 MG tablet Take 400 mg by mouth every 6 (six) hours as needed for mild pain.   Yes [provider]  famotidine (PEPCID) 20 MG tablet Take 1 tablet (20 mg total) by mouth 2 (two) times daily. 07/26/20 08/25/20  Tanda Rockers, PA-C    Scheduled Meds: Continuous Infusions: PRN Meds:.morphine injection  Allergies as of 08/26/2020   (No Known Allergies)    History reviewed. No pertinent family history.  Social History   Socioeconomic History   Marital  status: Single    Spouse name: Not on file   Number of children: Not on file   Years of education: Not on file   Highest education level: Not on file  Occupational History   Not on file  Tobacco Use   Smoking status: Never   Smokeless tobacco: Never  Vaping Use   Vaping Use: Every day  Substance and Sexual Activity   Alcohol use: Yes    Comment: occ   Drug use: Never   Sexual activity: Not on file  Other Topics Concern   Not on file  Social History Narrative   Not on file   Social Determinants of Health   Financial Resource Strain: Not on file  Food Insecurity: Not on file  Transportation Needs: Not on file  Physical Activity: Not on file  Stress: Not on file  Social Connections: Not on file  Intimate Partner Violence: Not on file    Review of Systems: 12 point review of system is done which is negative except as mentioned in HPI  Physical Exam: Vital signs: Vitals:   08/27/20 0400 08/27/20 0640  BP: 110/75 113/90  Pulse: 60 (!) 57  Resp:  16  Temp:  98.5 F (36.9 C)  SpO2: 100% 100%     Physical Exam Vitals reviewed.  Constitutional:      General: She is not in acute distress.    Appearance: She is well-developed. She is not ill-appearing.  HENT:     Head: Normocephalic.  Eyes:     Extraocular Movements:  Extraocular movements intact.  Cardiovascular:     Rate and Rhythm: Normal rate and regular rhythm.     Heart sounds: Normal heart sounds. No murmur heard. Pulmonary:     Effort: Pulmonary effort is normal. No respiratory distress.     Breath sounds: Normal breath sounds.  Abdominal:     General: Bowel sounds are normal.     Palpations: Abdomen is soft.     Tenderness: There is no abdominal tenderness. There is no guarding or rebound.  Musculoskeletal:     Right lower leg: No tenderness. No edema.     Left lower leg: No tenderness. No edema.  Skin:    General: Skin is warm.  Neurological:     Mental Status: She is alert and oriented to person,  place, and time.  Psychiatric:        Mood and Affect: Mood normal. Mood is not anxious.        Behavior: Behavior normal. Behavior is not agitated.     GI:  Lab Results: Recent Labs    08/26/20 2236  WBC 8.9  HGB 12.2  HCT 36.9  PLT 331   BMET Recent Labs    08/26/20 2236  NA 137  K 3.4*  CL 101  CO2 28  GLUCOSE 92  BUN 13  CREATININE 0.80  CALCIUM 9.2   LFT Recent Labs    08/26/20 2236  PROT 8.0  ALBUMIN 4.4  AST 219*  ALT 76*  ALKPHOS 86  BILITOT 3.8*   PT/INR No results for input(s): LABPROT, INR in the last 72 hours.   Studies/Results: DG Chest 2 View  Result Date: 08/26/2020 CLINICAL DATA:  Chest pain EXAM: CHEST - 2 VIEW COMPARISON:  None. FINDINGS: The heart size and mediastinal contours are within normal limits. Both lungs are clear. The visualized skeletal structures are unremarkable. IMPRESSION: No active cardiopulmonary disease. Electronically Signed   By: Helyn Numbers M.D.   On: 08/26/2020 22:47   CT ABDOMEN PELVIS W CONTRAST  Result Date: 08/27/2020 CLINICAL DATA:  Acute hepatitis EXAM: CT ABDOMEN AND PELVIS WITH CONTRAST TECHNIQUE: Multidetector CT imaging of the abdomen and pelvis was performed using the standard protocol following bolus administration of intravenous contrast. CONTRAST:  48mL OMNIPAQUE IOHEXOL 350 MG/ML SOLN COMPARISON:  None. FINDINGS: Lower chest: The visualized lung bases are clear. The visualized heart and pericardium are unremarkable. Hepatobiliary: There is mild periportal edema. The gallbladder is distended and there is mild dilation of the a extrahepatic bile duct which measures 8-9 mm in diameter, similar to prior sonogram of 08/27/2020. No pericholecystic inflammatory change. No enhancing intrahepatic mass identified. The hepatic vasculature is patent. Pancreas: Unremarkable Spleen: Unremarkable Adrenals/Urinary Tract: Adrenal glands are unremarkable. Kidneys are normal, without renal calculi, focal lesion, or  hydronephrosis. Bladder is unremarkable. Stomach/Bowel: Moderate stool seen throughout the colon. Stomach is within normal limits. Appendix appears normal. No evidence of bowel wall thickening, distention, or inflammatory changes. No free intraperitoneal gas or fluid. Vascular/Lymphatic: No significant vascular findings are present. No enlarged abdominal or pelvic lymph nodes. Reproductive: Uterus and bilateral adnexa are unremarkable. Other: No abdominal wall hernia.  Rectum unremarkable. Musculoskeletal: No acute bone abnormality. No lytic or blastic bone lesion identified. IMPRESSION: Mild dilation of the biliary tree, similar to that noted on recent sonogram. Though a distal obstructing lesion is not clearly identified, the distal duct is not optimally assessed with this technique. If there is clinical suspicion of a distal obstructing process, such as choledocholithiasis, ERCP or MRCP  examination may be more helpful for further evaluation. Mild periportal edema, nonspecific. Moderate stool seen throughout the colon without evidence of obstruction. Electronically Signed   By: Helyn Numbers M.D.   On: 08/27/2020 03:22   US Abdomen Limited  Result Date: 08/27/2020 CLINICAL DATA:  Right upper quadrant pain EXAM: ULTRASOUND ABDOMEN LIMITED RIGHT UPPER QUADRANT COMPARISON:  None. FINDINGS: Gallbladder: Very small amount of sludge in the gallbladder. A negative sonographic Eulah Pont sign was reported by the sonographer. No gallbladder wall thickening or pericholecystic fluid. Common bile duct: Diameter: 9 mm Liver: No focal lesion identified. Within normal limits in parenchymal echogenicity. Portal vein is patent on color Doppler imaging with normal direction of blood flow towards the liver. Other: None. IMPRESSION: Gallbladder sludge without other evidence of acute cholecystitis. Electronically Signed   By: Deatra Robinson M.D.   On: 08/27/2020 01:45    Impression/Plan: -Intermittent right upper quadrant and  epigastric pain since last few months.  Imaging study shows mild biliary ductal dilation with CBD of 8 to 9 mm.  She does have mild elevated LFTs.  This could be from biliary colic.  Clinical picture is not consistent with acute pancreatitis. ?  Passed stone. -Abnormal LFTs  Recommendations --------------------------- -Abdominal pain has resolved now. -Follow MRI MRCP.  She may benefit from cholecystectomy if MRI MRCP is negative -Monitor LFTs -Okay to have full liquid diet after MRI -GI will follow    LOS: 0 days   Kathi Der  MD, FACP 08/27/2020, 9:37 AM  Contact #  [phone]

## 2020-08-27 NOTE — Consult Note (Addendum)
CC/Reason for consult: Choledocholithiasis  Requesting provider: Dr. Waymon Amato  HPI: Cynthia Avery is an 25 y.o. female with hx GERD presented to [facility] ED 03/26/20 with acute onset sharp MEG/RUQ pain. Has had 3 prior attacks of similar pain. Nonradiating. No aggravating nor alleviating factors. No associated emesis nor fever/chills. She underwent evaluation and had findings concerning for choledocholithiasis.   Past Medical History:  Diagnosis Date   Pregnancy     History reviewed. No pertinent surgical history.  History reviewed. No pertinent family history.  Social:  reports that she has never smoked. She has never used smokeless tobacco. She reports current alcohol use. She reports that she does not use drugs.  Allergies: No Known Allergies  Medications: I have reviewed the patient's current medications.  Results for orders placed or performed during the hospital encounter of 08/26/20 (from the past 48 hour(s))  CBC with Differential/Platelet     Status: Abnormal   Collection Time: 08/26/20 10:36 PM  Result Value Ref Range   WBC 8.9 4.0 - 10.5 K/uL   RBC 3.86 (L) 3.87 - 5.11 MIL/uL   Hemoglobin 12.2 12.0 - 15.0 g/dL   HCT 22.0 25.4 - 27.0 %   MCV 95.6 80.0 - 100.0 fL   MCH 31.6 26.0 - 34.0 pg   MCHC 33.1 30.0 - 36.0 g/dL   RDW 62.3 (L) 76.2 - 83.1 %   Platelets 331 150 - 400 K/uL   nRBC 0.0 0.0 - 0.2 %   Neutrophils Relative % 83 %   Neutro Abs 7.4 1.7 - 7.7 K/uL   Lymphocytes Relative 8 %   Lymphs Abs 0.7 0.7 - 4.0 K/uL   Monocytes Relative 7 %   Monocytes Absolute 0.6 0.1 - 1.0 K/uL   Eosinophils Relative 0 %   Eosinophils Absolute 0.0 0.0 - 0.5 K/uL   Basophils Relative 1 %   Basophils Absolute 0.0 0.0 - 0.1 K/uL   Immature Granulocytes 1 %   Abs Immature Granulocytes 0.04 0.00 - 0.07 K/uL    Comment: Performed at [facility], [address] [place], [place] [address]  Comprehensive metabolic panel     Status: Abnormal   Collection Time: 08/26/20  10:36 PM  Result Value Ref Range   Sodium 137 135 - 145 mmol/L   Potassium 3.4 (L) 3.5 - 5.1 mmol/L   Chloride 101 98 - 111 mmol/L   CO2 28 22 - 32 mmol/L   Glucose, Bld 92 70 - 99 mg/dL    Comment: Glucose reference range applies only to samples taken after fasting for at least 8 hours.   BUN 13 6 - 20 mg/dL   Creatinine, Ser 6.07 0.44 - 1.00 mg/dL   Calcium 9.2 8.9 - 37.1 mg/dL   Total Protein 8.0 6.5 - 8.1 g/dL   Albumin 4.4 3.5 - 5.0 g/dL   AST 062 (H) 15 - 41 U/L   ALT 76 (H) 0 - 44 U/L   Alkaline Phosphatase 86 38 - 126 U/L   Total Bilirubin 3.8 (H) 0.3 - 1.2 mg/dL   GFR, Estimated >69 >48 mL/min    Comment: (NOTE) Calculated using the CKD-EPI Creatinine Equation (2021)    Anion gap 8 5 - 15    Comment: Performed at [facility], [address] [place], [place] [address]  Lipase, blood     Status: None   Collection Time: 08/26/20 10:36 PM  Result Value Ref Range   Lipase 36 11 -  51 U/L    Comment: Performed at [facility], [address] [place], [place] [address]  Troponin I (High Sensitivity)     Status: None   Collection Time: 08/26/20 10:36 PM  Result Value Ref Range   Troponin I (High Sensitivity) <2 <18 ng/L    Comment: (NOTE) Elevated high sensitivity troponin I (hsTnI) values and significant  changes across serial measurements may suggest ACS but many other  chronic and acute conditions are known to elevate hsTnI results.  Refer to the "Links" section for chest pain algorithms and additional  guidance. Performed at [facility], [address] [place], [place] [address]   Troponin I (High Sensitivity)     Status: None   Collection Time: 08/27/20  1:32 AM  Result Value Ref Range   Troponin I (High Sensitivity) <2 <18 ng/L    Comment: (NOTE) Elevated high sensitivity troponin I (hsTnI) values and significant  changes across serial measurements may suggest ACS but many other  chronic and acute conditions are known to  elevate hsTnI results.  Refer to the "Links" section for chest pain algorithms and additional  guidance. Performed at [facility], [address], [place], [place] [address]   I-Stat Beta hCG blood, ED ([facility], [facility], [facility] only)     Status: None   Collection Time: 08/27/20  2:23 AM  Result Value Ref Range   I-stat hCG, quantitative <5.0 <5 mIU/mL   Comment 3            Comment:   GEST. AGE      CONC.  (mIU/mL)   <=1 WEEK        5 - 50     2 WEEKS       50 - 500     3 WEEKS       100 - 10,000     4 WEEKS     1,000 - 30,000        FEMALE AND NON-PREGNANT FEMALE:     LESS THAN 5 mIU/mL   Resp Panel by RT-PCR (Flu A&B, Covid) Nasopharyngeal Swab     Status: None   Collection Time: 08/27/20  4:20 AM   Specimen: Nasopharyngeal Swab; Nasopharyngeal(NP) swabs in vial transport medium  Result Value Ref Range   SARS Coronavirus 2 by RT PCR NEGATIVE NEGATIVE    Comment: (NOTE) SARS-CoV-2 target nucleic acids are NOT DETECTED.  The SARS-CoV-2 RNA is generally detectable in upper respiratory specimens during the acute phase of infection. The lowest concentration of SARS-CoV-2 viral copies this assay can detect is 138 copies/mL. A negative result does not preclude SARS-Cov-2 infection and should not be used as the sole basis for treatment or other patient management decisions. A negative result may occur with  improper specimen collection/handling, submission of specimen other than nasopharyngeal swab, presence of viral mutation(s) within the areas targeted by this assay, and inadequate number of viral copies(<138 copies/mL). A negative result must be combined with clinical observations, patient history, and epidemiological information. The expected result is Negative.  Fact Sheet for Patients:  BloggerCourse.com  Fact Sheet for Healthcare Providers:  SeriousBroker.it  This test is no t yet approved or cleared by the [place] FDA and  has been authorized for detection and/or diagnosis of SARS-CoV-2 by FDA under an Emergency Use Authorization (EUA). This EUA will remain  in effect (meaning this test can be used) for the duration of the COVID-19 declaration under Section  564(b)(1) of the Act, 21 U.S.C.section 360bbb-3(b)(1), unless the authorization is terminated  or revoked sooner.       Influenza A by PCR NEGATIVE NEGATIVE   Influenza B by PCR NEGATIVE NEGATIVE    Comment: (NOTE) The Xpert Xpress SARS-CoV-2/FLU/RSV plus assay is intended as an aid in the diagnosis of influenza from Nasopharyngeal swab specimens and should not be used as a sole basis for treatment. Nasal washings and aspirates are unacceptable for Xpert Xpress SARS-CoV-2/FLU/RSV testing.  Fact Sheet for Patients: BloggerCourse.com  Fact Sheet for Healthcare Providers: SeriousBroker.it  This test is not yet approved or cleared by the [place] FDA and has been authorized for detection and/or diagnosis of SARS-CoV-2 by FDA under an Emergency Use Authorization (EUA). This EUA will remain in effect (meaning this test can be used) for the duration of the COVID-19 declaration under Section 564(b)(1) of the Act, 21 U.S.C. section 360bbb-3(b)(1), unless the authorization is terminated or revoked.  Performed at [facility], [address], [place], [place] [address]   HIV Antibody (routine testing w rflx)     Status: None   Collection Time: 08/27/20 11:12 AM  Result Value Ref Range   HIV Screen 4th Generation wRfx Non Reactive Non Reactive    Comment: Performed at [facility], [address], [place], [place] [address]  Magnesium     Status: None   Collection Time: 08/27/20 11:12 AM  Result Value Ref Range   Magnesium 2.1 1.7 - 2.4 mg/dL    Comment: Performed at [facility], [address], [place], [place] [address]  Hepatic  function panel     Status: Abnormal   Collection Time: 08/27/20 11:12 AM  Result Value Ref Range   Total Protein 7.6 6.5 - 8.1 g/dL   Albumin 4.1 3.5 - 5.0 g/dL   AST 170 (H) 15 - 41 U/L   ALT 453 (H) 0 - 44 U/L   Alkaline Phosphatase 124 38 - 126 U/L   Total Bilirubin 6.4 (H) 0.3 - 1.2 mg/dL   Bilirubin, Direct 2.8 (H) 0.0 - 0.2 mg/dL   Indirect Bilirubin 3.6 (H) 0.3 - 0.9 mg/dL    Comment: Performed at [facility], [address], [place], [place] [address]  Comprehensive metabolic panel     Status: Abnormal   Collection Time: 08/28/20  3:54 AM  Result Value Ref Range   Sodium 138 135 - 145 mmol/L   Potassium 4.0 3.5 - 5.1 mmol/L   Chloride 105 98 - 111 mmol/L   CO2 28 22 - 32 mmol/L   Glucose, Bld 78 70 - 99 mg/dL    Comment: Glucose reference range applies only to samples taken after fasting for at least 8 hours.   BUN 8 6 - 20 mg/dL   Creatinine, Ser 4.49 0.44 - 1.00 mg/dL   Calcium 9.5 8.9 - 67.5 mg/dL   Total Protein 6.9 6.5 - 8.1 g/dL   Albumin 3.8 3.5 - 5.0 g/dL   AST 916 (H) 15 - 41 U/L   ALT 329 (H) 0 - 44 U/L   Alkaline Phosphatase 130 (H) 38 - 126 U/L   Total Bilirubin 5.6 (H) 0.3 - 1.2 mg/dL   GFR, Estimated >38 >46 mL/min    Comment: (NOTE) Calculated using the CKD-EPI Creatinine Equation (2021)    Anion gap 5 5 - 15    Comment: Performed at [facility], [address] [place], [place] [address]  Lipase, blood     Status: None   Collection Time: 08/28/20  3:54 AM  Result Value Ref Range   Lipase 25 11 - 51 U/L    Comment: Performed at [facility], [address] [place], [place] [address]    DG Chest 2 View  Result Date: 08/26/2020 CLINICAL DATA:  Chest pain EXAM: CHEST - 2 VIEW COMPARISON:  None. FINDINGS: The heart size and mediastinal contours are within normal limits. Both lungs are clear. The visualized skeletal structures are unremarkable. IMPRESSION: No active cardiopulmonary disease.  Electronically Signed   By: Helyn Numbers M.D.   On: 08/26/2020 22:47   CT ABDOMEN PELVIS W CONTRAST  Result Date: 08/27/2020 CLINICAL DATA:  Acute hepatitis EXAM: CT ABDOMEN AND PELVIS WITH CONTRAST TECHNIQUE: Multidetector CT imaging of the abdomen and pelvis was performed using the standard protocol following bolus administration of intravenous contrast. CONTRAST:  30mL OMNIPAQUE IOHEXOL 350 MG/ML SOLN COMPARISON:  None. FINDINGS: Lower chest: The visualized lung bases are clear. The visualized heart and pericardium are unremarkable. Hepatobiliary: There is mild periportal edema. The gallbladder is distended and there is mild dilation of the a extrahepatic bile duct which measures 8-9 mm in diameter, similar to prior sonogram of 08/27/2020. No pericholecystic inflammatory change. No enhancing intrahepatic mass identified. The hepatic vasculature is patent. Pancreas: Unremarkable Spleen: Unremarkable Adrenals/Urinary Tract: Adrenal glands are unremarkable. Kidneys are normal, without renal calculi, focal lesion, or hydronephrosis. Bladder is unremarkable. Stomach/Bowel: Moderate stool seen throughout the colon. Stomach is within normal limits. Appendix appears normal. No evidence of bowel wall thickening, distention, or inflammatory changes. No free intraperitoneal gas or fluid. Vascular/Lymphatic: No significant vascular findings are present. No enlarged abdominal or pelvic lymph nodes. Reproductive: Uterus and bilateral adnexa are unremarkable. Other: No abdominal wall hernia.  Rectum unremarkable. Musculoskeletal: No acute bone abnormality. No lytic or blastic bone lesion identified. IMPRESSION: Mild dilation of the biliary tree, similar to that noted on recent sonogram. Though a distal obstructing lesion is not clearly identified, the distal duct is not optimally assessed with this technique. If there is clinical suspicion of a distal obstructing process, such as choledocholithiasis, ERCP or MRCP  examination may be more helpful for further evaluation. Mild periportal edema, nonspecific. Moderate stool seen throughout the colon without evidence of obstruction. Electronically Signed   By: Helyn Numbers M.D.   On: 08/27/2020 03:22   MR 3D Recon At Scanner  Result Date: 08/27/2020 CLINICAL DATA:  Jaundice in a 25 year old female reportedly with acute hepatitis. EXAM: MRI ABDOMEN WITHOUT AND WITH CONTRAST (INCLUDING MRCP) TECHNIQUE: Multiplanar multisequence MR imaging of the abdomen was performed both before and after the administration of intravenous contrast. Heavily T2-weighted images of the biliary and pancreatic ducts were obtained, and three-dimensional MRCP images were rendered by post processing. CONTRAST:  55mL GADAVIST GADOBUTROL 1 MMOL/ML IV SOLN COMPARISON:  CT evaluation August 27, 2020. FINDINGS: Lower chest: Incidental imaging of the lung bases is unremarkable by MRI on limited assessment. Hepatobiliary: MRCP images are limited by respiratory motion. There is a potential 3 mm filling defect in the dependent common bile duct. There is evidence of cholelithiasis. Diminished biliary duct and gallbladder distension since the previous evaluation. Potential sludge layering dependently best seen on image 17 of series 3 as low signal material in the dependent CBD but without focal characteristics. Potential filling defect, approximately 3 mm, seen on oblique reformatted images (image 191/102) common bile duct caliber now approximately 5 mm as compared to 8 mm.  Diminished caliber of intrahepatic biliary ducts and decreased distension of the common bile duct. Diminished periportal edema. No focal, suspicious hepatic lesion. Pancreas: No sign of pancreatic edema. Normal intrinsic T1 signal in the pancreas. Pancreatic duct is nondilated. Spleen:  Normal spleen. Adrenals/Urinary Tract:  Adrenal glands are normal. Symmetric renal enhancement.  No hydronephrosis. Stomach/Bowel: Gastrointestinal tract is  unremarkable to the extent evaluated on abdominal MRI. Vascular/Lymphatic: Vascular structures in the abdomen are patent. There is no gastrohepatic or hepatoduodenal ligament lymphadenopathy. No retroperitoneal or mesenteric lymphadenopathy. Other:  No ascites. Musculoskeletal: No suspicious bone lesions identified. IMPRESSION: 1. Diminished biliary duct dilation and distension of the gallbladder compared to the prior study. Correlate with any improvement in symptoms that might indicate recent passage of a larger biliary calculus. 2. Suspected small intraductal calculus in the common bile duct at 3 mm though with limited assessment due to motion artifact on MRCP sequences, perhaps with a small amount of adjacent sludge. 3. Cholelithiasis. 4. Diminished periportal edema. 5. No sign of pancreatic edema. These results will be called to the ordering clinician or representative by the Radiologist Assistant, and communication documented in the PACS or [employer] Dashboard. Electronically Signed   By: Donzetta KohutGeoffrey  Wile M.D.   On: 08/27/2020 11:32   US Abdomen Limited  Result Date: 08/27/2020 CLINICAL DATA:  Right upper quadrant pain EXAM: ULTRASOUND ABDOMEN LIMITED RIGHT UPPER QUADRANT COMPARISON:  None. FINDINGS: Gallbladder: Very small amount of sludge in the gallbladder. A negative sonographic Eulah PontMurphy sign was reported by the sonographer. No gallbladder wall thickening or pericholecystic fluid. Common bile duct: Diameter: 9 mm Liver: No focal lesion identified. Within normal limits in parenchymal echogenicity. Portal vein is patent on color Doppler imaging with normal direction of blood flow towards the liver. Other: None. IMPRESSION: Gallbladder sludge without other evidence of acute cholecystitis. Electronically Signed   By: Deatra RobinsonKevin  Herman M.D.   On: 08/27/2020 01:45   MR ABDOMEN MRCP W WO CONTAST  Result Date: 08/27/2020 CLINICAL DATA:  Jaundice in a 25 year old female reportedly with acute hepatitis. EXAM: MRI  ABDOMEN WITHOUT AND WITH CONTRAST (INCLUDING MRCP) TECHNIQUE: Multiplanar multisequence MR imaging of the abdomen was performed both before and after the administration of intravenous contrast. Heavily T2-weighted images of the biliary and pancreatic ducts were obtained, and three-dimensional MRCP images were rendered by post processing. CONTRAST:  6mL GADAVIST GADOBUTROL 1 MMOL/ML IV SOLN COMPARISON:  CT evaluation August 27, 2020. FINDINGS: Lower chest: Incidental imaging of the lung bases is unremarkable by MRI on limited assessment. Hepatobiliary: MRCP images are limited by respiratory motion. There is a potential 3 mm filling defect in the dependent common bile duct. There is evidence of cholelithiasis. Diminished biliary duct and gallbladder distension since the previous evaluation. Potential sludge layering dependently best seen on image 17 of series 3 as low signal material in the dependent CBD but without focal characteristics. Potential filling defect, approximately 3 mm, seen on oblique reformatted images (image 191/102) common bile duct caliber now approximately 5 mm as compared to 8 mm. Diminished caliber of intrahepatic biliary ducts and decreased distension of the common bile duct. Diminished periportal edema. No focal, suspicious hepatic lesion. Pancreas: No sign of pancreatic edema. Normal intrinsic T1 signal in the pancreas. Pancreatic duct is nondilated. Spleen:  Normal spleen. Adrenals/Urinary Tract:  Adrenal glands are normal. Symmetric renal enhancement.  No hydronephrosis. Stomach/Bowel: Gastrointestinal tract is unremarkable to the extent evaluated on abdominal MRI. Vascular/Lymphatic: Vascular structures in the abdomen are patent. There is no gastrohepatic or hepatoduodenal  ligament lymphadenopathy. No retroperitoneal or mesenteric lymphadenopathy. Other:  No ascites. Musculoskeletal: No suspicious bone lesions identified. IMPRESSION: 1. Diminished biliary duct dilation and distension of the  gallbladder compared to the prior study. Correlate with any improvement in symptoms that might indicate recent passage of a larger biliary calculus. 2. Suspected small intraductal calculus in the common bile duct at 3 mm though with limited assessment due to motion artifact on MRCP sequences, perhaps with a small amount of adjacent sludge. 3. Cholelithiasis. 4. Diminished periportal edema. 5. No sign of pancreatic edema. These results will be called to the ordering clinician or representative by the Radiologist Assistant, and communication documented in the PACS or [employer]. Electronically Signed   By: Donzetta Kohut M.D.   On: 08/27/2020 11:32    ROS - all of the below systems have been reviewed with the patient and positives are indicated with bold text General: chills, fever or night sweats Eyes: blurry vision or double vision ENT: epistaxis or sore throat Allergy/Immunology: itchy/watery eyes or nasal congestion Hematologic/Lymphatic: bleeding problems, blood clots or swollen lymph nodes Endocrine: temperature intolerance or unexpected weight changes Breast: new or changing breast lumps or nipple discharge Resp: cough, shortness of breath, or wheezing CV: chest pain or dyspnea on exertion GI: as per HPI GU: dysuria, trouble voiding, or hematuria MSK: joint pain or joint stiffness Neuro: TIA or stroke symptoms Derm: pruritus and skin lesion changes Psych: anxiety and depression  PE Blood pressure 110/71, pulse 61, temperature 98.5 F (36.9 C), temperature source Oral, resp. rate 14, height  (1.626 m), weight 54.4 kg, last menstrual period 07/16/2020, SpO2 100 %. Constitutional: NAD; conversant; no deformities Eyes: Moist conjunctiva; no lid lag; anicteric; PERRL Neck: Trachea midline; no thyromegaly Lungs: Normal respiratory effort; no tactile fremitus CV: RRR; no palpable thrills; no pitting edema GI: Abd soft, nontender/nondistended without Murphy's sign; no palpable  hepatosplenomegaly MSK: Normal range of motion of extremities; no clubbing/cyanosis Psychiatric: Appropriate affect; alert and oriented x3 Lymphatic: No palpable cervical or axillary lymphadenopathy  Results for orders placed or performed during the hospital encounter of 08/26/20 (from the past 48 hour(s))  CBC with Differential/Platelet     Status: Abnormal   Collection Time: 08/26/20 10:36 PM  Result Value Ref Range   WBC 8.9 4.0 - 10.5 K/uL   RBC 3.86 (L) 3.87 - 5.11 MIL/uL   Hemoglobin 12.2 12.0 - 15.0 g/dL   HCT 16.1 09.6 - 04.5 %   MCV 95.6 80.0 - 100.0 fL   MCH 31.6 26.0 - 34.0 pg   MCHC 33.1 30.0 - 36.0 g/dL   RDW 40.9 (L) 81.1 - 91.4 %   Platelets 331 150 - 400 K/uL   nRBC 0.0 0.0 - 0.2 %   Neutrophils Relative % 83 %   Neutro Abs 7.4 1.7 - 7.7 K/uL   Lymphocytes Relative 8 %   Lymphs Abs 0.7 0.7 - 4.0 K/uL   Monocytes Relative 7 %   Monocytes Absolute 0.6 0.1 - 1.0 K/uL   Eosinophils Relative 0 %   Eosinophils Absolute 0.0 0.0 - 0.5 K/uL   Basophils Relative 1 %   Basophils Absolute 0.0 0.0 - 0.1 K/uL   Immature Granulocytes 1 %   Abs Immature Granulocytes 0.04 0.00 - 0.07 K/uL    Comment: Performed at [facility], [address] [place], [place] [address]  Comprehensive metabolic panel     Status: Abnormal   Collection Time: 08/26/20 10:36 PM  Result Value  Ref Range   Sodium 137 135 - 145 mmol/L   Potassium 3.4 (L) 3.5 - 5.1 mmol/L   Chloride 101 98 - 111 mmol/L   CO2 28 22 - 32 mmol/L   Glucose, Bld 92 70 - 99 mg/dL    Comment: Glucose reference range applies only to samples taken after fasting for at least 8 hours.   BUN 13 6 - 20 mg/dL   Creatinine, Ser 8.11 0.44 - 1.00 mg/dL   Calcium 9.2 8.9 - 91.4 mg/dL   Total Protein 8.0 6.5 - 8.1 g/dL   Albumin 4.4 3.5 - 5.0 g/dL   AST 782 (H) 15 - 41 U/L   ALT 76 (H) 0 - 44 U/L   Alkaline Phosphatase 86 38 - 126 U/L   Total Bilirubin 3.8 (H) 0.3 - 1.2 mg/dL   GFR, Estimated >95 >62 mL/min     Comment: (NOTE) Calculated using the CKD-EPI Creatinine Equation (2021)    Anion gap 8 5 - 15    Comment: Performed at [facility], [address] [place], [place] [address]  Lipase, blood     Status: None   Collection Time: 08/26/20 10:36 PM  Result Value Ref Range   Lipase 36 11 - 51 U/L    Comment: Performed at [facility], [address] [place], [place] [address]  Troponin I (High Sensitivity)     Status: None   Collection Time: 08/26/20 10:36 PM  Result Value Ref Range   Troponin I (High Sensitivity) <2 <18 ng/L    Comment: (NOTE) Elevated high sensitivity troponin I (hsTnI) values and significant  changes across serial measurements may suggest ACS but many other  chronic and acute conditions are known to elevate hsTnI results.  Refer to the "Links" section for chest pain algorithms and additional  guidance. Performed at [facility], [address] [place], [place] [address]   Troponin I (High Sensitivity)     Status: None   Collection Time: 08/27/20  1:32 AM  Result Value Ref Range   Troponin I (High Sensitivity) <2 <18 ng/L    Comment: (NOTE) Elevated high sensitivity troponin I (hsTnI) values and significant  changes across serial measurements may suggest ACS but many other  chronic and acute conditions are known to elevate hsTnI results.  Refer to the "Links" section for chest pain algorithms and additional  guidance. Performed at [facility], [address] [place], [place] [address]   I-Stat Beta hCG blood, ED ([facility], [facility], [facility] only)     Status: None   Collection Time: 08/27/20  2:23 AM  Result Value Ref Range   I-stat hCG, quantitative <5.0 <5 mIU/mL   Comment 3            Comment:   GEST. AGE      CONC.  (mIU/mL)   <=1 WEEK        5 - 50     2 WEEKS       50 - 500     3 WEEKS       100 - 10,000     4 WEEKS     1,000 - 30,000        FEMALE AND NON-PREGNANT FEMALE:     LESS THAN 5 mIU/mL   Resp Panel by  RT-PCR (Flu A&B, Covid) Nasopharyngeal Swab     Status: None   Collection Time: 08/27/20  4:20 AM   Specimen:  Nasopharyngeal Swab; Nasopharyngeal(NP) swabs in vial transport medium  Result Value Ref Range   SARS Coronavirus 2 by RT PCR NEGATIVE NEGATIVE    Comment: (NOTE) SARS-CoV-2 target nucleic acids are NOT DETECTED.  The SARS-CoV-2 RNA is generally detectable in upper respiratory specimens during the acute phase of infection. The lowest concentration of SARS-CoV-2 viral copies this assay can detect is 138 copies/mL. A negative result does not preclude SARS-Cov-2 infection and should not be used as the sole basis for treatment or other patient management decisions. A negative result may occur with  improper specimen collection/handling, submission of specimen other than nasopharyngeal swab, presence of viral mutation(s) within the areas targeted by this assay, and inadequate number of viral copies(<138 copies/mL). A negative result must be combined with clinical observations, patient history, and epidemiological information. The expected result is Negative.  Fact Sheet for Patients:  BloggerCourse.com  Fact Sheet for Healthcare Providers:  SeriousBroker.it  This test is no t yet approved or cleared by the [place] FDA and  has been authorized for detection and/or diagnosis of SARS-CoV-2 by FDA under an Emergency Use Authorization (EUA). This EUA will remain  in effect (meaning this test can be used) for the duration of the COVID-19 declaration under Section 564(b)(1) of the Act, 21 U.S.C.section 360bbb-3(b)(1), unless the authorization is terminated  or revoked sooner.       Influenza A by PCR NEGATIVE NEGATIVE   Influenza B by PCR NEGATIVE NEGATIVE    Comment: (NOTE) The Xpert Xpress SARS-CoV-2/FLU/RSV plus assay is intended as an aid in the diagnosis of influenza from Nasopharyngeal swab specimens and should not be  used as a sole basis for treatment. Nasal washings and aspirates are unacceptable for Xpert Xpress SARS-CoV-2/FLU/RSV testing.  Fact Sheet for Patients: BloggerCourse.com  Fact Sheet for Healthcare Providers: SeriousBroker.it  This test is not yet approved or cleared by the [place] FDA and has been authorized for detection and/or diagnosis of SARS-CoV-2 by FDA under an Emergency Use Authorization (EUA). This EUA will remain in effect (meaning this test can be used) for the duration of the COVID-19 declaration under Section 564(b)(1) of the Act, 21 U.S.C. section 360bbb-3(b)(1), unless the authorization is terminated or revoked.  Performed at [facility], [address] [place], [place] [address]   HIV Antibody (routine testing w rflx)     Status: None   Collection Time: 08/27/20 11:12 AM  Result Value Ref Range   HIV Screen 4th Generation wRfx Non Reactive Non Reactive    Comment: Performed at [facility], [address], [place], [place] [address]  Magnesium     Status: None   Collection Time: 08/27/20 11:12 AM  Result Value Ref Range   Magnesium 2.1 1.7 - 2.4 mg/dL    Comment: Performed at [facility], [address], [place], [place] [address]  Hepatic function panel     Status: Abnormal   Collection Time: 08/27/20 11:12 AM  Result Value Ref Range   Total Protein 7.6 6.5 - 8.1 g/dL   Albumin 4.1 3.5 - 5.0 g/dL   AST 213 (H) 15 - 41 U/L   ALT 453 (H) 0 - 44 U/L   Alkaline Phosphatase 124 38 - 126 U/L   Total Bilirubin 6.4 (H) 0.3 - 1.2 mg/dL   Bilirubin, Direct 2.8 (H) 0.0 - 0.2 mg/dL   Indirect Bilirubin 3.6 (H) 0.3 - 0.9 mg/dL    Comment: Performed at [facility], [address] [place], [place] [address]  Comprehensive metabolic panel     Status: Abnormal   Collection Time: 08/28/20  3:54 AM  Result Value Ref Range   Sodium 138 135 - 145 mmol/L    Potassium 4.0 3.5 - 5.1 mmol/L   Chloride 105 98 - 111 mmol/L   CO2 28 22 - 32 mmol/L   Glucose, Bld 78 70 - 99 mg/dL    Comment: Glucose reference range applies only to samples taken after fasting for at least 8 hours.   BUN 8 6 - 20 mg/dL   Creatinine, Ser 1.91 0.44 - 1.00 mg/dL   Calcium 9.5 8.9 - 47.8 mg/dL   Total Protein 6.9 6.5 - 8.1 g/dL   Albumin 3.8 3.5 - 5.0 g/dL   AST 295 (H) 15 - 41 U/L   ALT 329 (H) 0 - 44 U/L   Alkaline Phosphatase 130 (H) 38 - 126 U/L   Total Bilirubin 5.6 (H) 0.3 - 1.2 mg/dL   GFR, Estimated >62 >13 mL/min    Comment: (NOTE) Calculated using the CKD-EPI Creatinine Equation (2021)    Anion gap 5 5 - 15    Comment: Performed at [facility], [address] [place], [place] [address]  Lipase, blood     Status: None   Collection Time: 08/28/20  3:54 AM  Result Value Ref Range   Lipase 25 11 - 51 U/L    Comment: Performed at [facility], [address], [place], [place] [address]    DG Chest 2 View  Result Date: 08/26/2020 CLINICAL DATA:  Chest pain EXAM: CHEST - 2 VIEW COMPARISON:  None. FINDINGS: The heart size and mediastinal contours are within normal limits. Both lungs are clear. The visualized skeletal structures are unremarkable. IMPRESSION: No active cardiopulmonary disease. Electronically Signed   By: Helyn Numbers M.D.   On: 08/26/2020 22:47   CT ABDOMEN PELVIS W CONTRAST  Result Date: 08/27/2020 CLINICAL DATA:  Acute hepatitis EXAM: CT ABDOMEN AND PELVIS WITH CONTRAST TECHNIQUE: Multidetector CT imaging of the abdomen and pelvis was performed using the standard protocol following bolus administration of intravenous contrast. CONTRAST:  80mL OMNIPAQUE IOHEXOL 350 MG/ML SOLN COMPARISON:  None. FINDINGS: Lower chest: The visualized lung bases are clear. The visualized heart and pericardium are unremarkable. Hepatobiliary: There is mild periportal edema. The gallbladder is distended and there is mild  dilation of the a extrahepatic bile duct which measures 8-9 mm in diameter, similar to prior sonogram of 08/27/2020. No pericholecystic inflammatory change. No enhancing intrahepatic mass identified. The hepatic vasculature is patent. Pancreas: Unremarkable Spleen: Unremarkable Adrenals/Urinary Tract: Adrenal glands are unremarkable. Kidneys are normal, without renal calculi, focal lesion, or hydronephrosis. Bladder is unremarkable. Stomach/Bowel: Moderate stool seen throughout the colon. Stomach is within normal limits. Appendix appears normal. No evidence of bowel wall thickening, distention, or inflammatory changes. No free intraperitoneal gas or fluid. Vascular/Lymphatic: No significant vascular findings are present. No enlarged abdominal or pelvic lymph nodes. Reproductive: Uterus and bilateral adnexa are unremarkable. Other: No abdominal wall hernia.  Rectum unremarkable. Musculoskeletal: No acute bone abnormality. No lytic or blastic bone lesion identified. IMPRESSION: Mild dilation of the biliary tree, similar to that noted on recent sonogram. Though a distal obstructing lesion is not clearly identified, the distal duct is not optimally assessed with this technique. If there is clinical suspicion of a distal obstructing process, such as choledocholithiasis, ERCP or MRCP examination may be more helpful for further evaluation. Mild periportal edema,  nonspecific. Moderate stool seen throughout the colon without evidence of obstruction. Electronically Signed   By: Helyn Numbers M.D.   On: 08/27/2020 03:22   MR 3D Recon At Scanner  Result Date: 08/27/2020 CLINICAL DATA:  Jaundice in a 25 year old female reportedly with acute hepatitis. EXAM: MRI ABDOMEN WITHOUT AND WITH CONTRAST (INCLUDING MRCP) TECHNIQUE: Multiplanar multisequence MR imaging of the abdomen was performed both before and after the administration of intravenous contrast. Heavily T2-weighted images of the biliary and pancreatic ducts were  obtained, and three-dimensional MRCP images were rendered by post processing. CONTRAST:  6mL GADAVIST GADOBUTROL 1 MMOL/ML IV SOLN COMPARISON:  CT evaluation August 27, 2020. FINDINGS: Lower chest: Incidental imaging of the lung bases is unremarkable by MRI on limited assessment. Hepatobiliary: MRCP images are limited by respiratory motion. There is a potential 3 mm filling defect in the dependent common bile duct. There is evidence of cholelithiasis. Diminished biliary duct and gallbladder distension since the previous evaluation. Potential sludge layering dependently best seen on image 17 of series 3 as low signal material in the dependent CBD but without focal characteristics. Potential filling defect, approximately 3 mm, seen on oblique reformatted images (image 191/102) common bile duct caliber now approximately 5 mm as compared to 8 mm. Diminished caliber of intrahepatic biliary ducts and decreased distension of the common bile duct. Diminished periportal edema. No focal, suspicious hepatic lesion. Pancreas: No sign of pancreatic edema. Normal intrinsic T1 signal in the pancreas. Pancreatic duct is nondilated. Spleen:  Normal spleen. Adrenals/Urinary Tract:  Adrenal glands are normal. Symmetric renal enhancement.  No hydronephrosis. Stomach/Bowel: Gastrointestinal tract is unremarkable to the extent evaluated on abdominal MRI. Vascular/Lymphatic: Vascular structures in the abdomen are patent. There is no gastrohepatic or hepatoduodenal ligament lymphadenopathy. No retroperitoneal or mesenteric lymphadenopathy. Other:  No ascites. Musculoskeletal: No suspicious bone lesions identified. IMPRESSION: 1. Diminished biliary duct dilation and distension of the gallbladder compared to the prior study. Correlate with any improvement in symptoms that might indicate recent passage of a larger biliary calculus. 2. Suspected small intraductal calculus in the common bile duct at 3 mm though with limited assessment due to  motion artifact on MRCP sequences, perhaps with a small amount of adjacent sludge. 3. Cholelithiasis. 4. Diminished periportal edema. 5. No sign of pancreatic edema. These results will be called to the ordering clinician or representative by the Radiologist Assistant, and communication documented in the PACS or [employer]. Electronically Signed   By: Donzetta Kohut M.D.   On: 08/27/2020 11:32   US Abdomen Limited  Result Date: 08/27/2020 CLINICAL DATA:  Right upper quadrant pain EXAM: ULTRASOUND ABDOMEN LIMITED RIGHT UPPER QUADRANT COMPARISON:  None. FINDINGS: Gallbladder: Very small amount of sludge in the gallbladder. A negative sonographic Eulah Pont sign was reported by the sonographer. No gallbladder wall thickening or pericholecystic fluid. Common bile duct: Diameter: 9 mm Liver: No focal lesion identified. Within normal limits in parenchymal echogenicity. Portal vein is patent on color Doppler imaging with normal direction of blood flow towards the liver. Other: None. IMPRESSION: Gallbladder sludge without other evidence of acute cholecystitis. Electronically Signed   By: Deatra Robinson M.D.   On: 08/27/2020 01:45   MR ABDOMEN MRCP W WO CONTAST  Result Date: 08/27/2020 CLINICAL DATA:  Jaundice in a 25 year old female reportedly with acute hepatitis. EXAM: MRI ABDOMEN WITHOUT AND WITH CONTRAST (INCLUDING MRCP) TECHNIQUE: Multiplanar multisequence MR imaging of the abdomen was performed both before and after the administration of intravenous contrast. Heavily T2-weighted images of the biliary  and pancreatic ducts were obtained, and three-dimensional MRCP images were rendered by post processing. CONTRAST:  6mL GADAVIST GADOBUTROL 1 MMOL/ML IV SOLN COMPARISON:  CT evaluation August 27, 2020. FINDINGS: Lower chest: Incidental imaging of the lung bases is unremarkable by MRI on limited assessment. Hepatobiliary: MRCP images are limited by respiratory motion. There is a potential 3 mm filling defect in the  dependent common bile duct. There is evidence of cholelithiasis. Diminished biliary duct and gallbladder distension since the previous evaluation. Potential sludge layering dependently best seen on image 17 of series 3 as low signal material in the dependent CBD but without focal characteristics. Potential filling defect, approximately 3 mm, seen on oblique reformatted images (image 191/102) common bile duct caliber now approximately 5 mm as compared to 8 mm. Diminished caliber of intrahepatic biliary ducts and decreased distension of the common bile duct. Diminished periportal edema. No focal, suspicious hepatic lesion. Pancreas: No sign of pancreatic edema. Normal intrinsic T1 signal in the pancreas. Pancreatic duct is nondilated. Spleen:  Normal spleen. Adrenals/Urinary Tract:  Adrenal glands are normal. Symmetric renal enhancement.  No hydronephrosis. Stomach/Bowel: Gastrointestinal tract is unremarkable to the extent evaluated on abdominal MRI. Vascular/Lymphatic: Vascular structures in the abdomen are patent. There is no gastrohepatic or hepatoduodenal ligament lymphadenopathy. No retroperitoneal or mesenteric lymphadenopathy. Other:  No ascites. Musculoskeletal: No suspicious bone lesions identified. IMPRESSION: 1. Diminished biliary duct dilation and distension of the gallbladder compared to the prior study. Correlate with any improvement in symptoms that might indicate recent passage of a larger biliary calculus. 2. Suspected small intraductal calculus in the common bile duct at 3 mm though with limited assessment due to motion artifact on MRCP sequences, perhaps with a small amount of adjacent sludge. 3. Cholelithiasis. 4. Diminished periportal edema. 5. No sign of pancreatic edema. These results will be called to the ordering clinician or representative by the Radiologist Assistant, and communication documented in the PACS or [employer]. Electronically Signed   By: Donzetta Kohut M.D.   On:  08/27/2020 11:32     A/P: Cynthia Avery is an 25 y.o. female with choledocholithiasis  -Tbili 3.8 > 6.4 > 5.6. MRCP shows probable stone in cbd as well. -Agree with ERCP; would benefit from cholecystectomy prior to discharge -The anatomy and physiology of the hepatobiliary system was discussed with the patient. The pathophysiology of gallbladder disease was discussed as well including what choledocholithiasis represents -We reviewed laparoscopic cholecystectomy as a means to reduce probability of recurrence of choledocholithiasis.  Marin Olp, MD [facility] Use [email or web address] to contact on call provider

## 2020-08-27 NOTE — ED Notes (Addendum)
Patient instructed to report to [facility] ED for eval and abd [place]. Patient and family verbalize understanding. Patient to go to [facility] via POV

## 2020-08-27 NOTE — ED Provider Notes (Signed)
Patient transferred from [facility] for RUQ [place]. RUQ and mid back pain after eating, third episode.  Elevated LFTs, lipase normal.   Gallbladder shows sludge without evidence of cholecystitis.  CT scan will be obtained to evaluate for choledocholithiasis.  CT scan shows dilation of biliary tree without obvious obstructing lesion.  Given elevated LFTs there is concern for choledocholithiasis.  Patient may benefit from MRCP.  Low suspicion for cholangitis.  No fever.  Message sent to [facility].  Admission discussed with Dr. Loney Loh.    Glynn Octave, MD 08/27/20 [phone]

## 2020-08-27 NOTE — H&P (Signed)
History and Physical    Cynthia Avery HBZ:[id number] DOB: [birth year] DOA: 08/26/2020  PCP: Pcp, No Patient coming from: Home  Chief Complaint: Abdominal pain  HPI: Cynthia Avery is a 25 y.o. female with medical history significant of GERD presented to [facility] with complaints of epigastric and right upper quadrant abdominal pain.  Labs revealed elevated liver enzymes (AST 219, ALT 76, T bili 3.8) with normal lipase.  High-sensitivity troponin negative x2.  Beta-hCG negative.  Screening COVID test pending.  Chest x-ray showing no active cardiopulmonary disease.  Patient was transferred to [facility] for right upper quadrant ultrasound which revealed gallbladder sludge without evidence of acute cholecystitis.  CT scan showing mild dilation of biliary tree without obvious obstructing lesion.  Due to concern for choledocholithiasis, ED physician sent a secure chat message to on-call physician for [facility] requesting consultation in the morning.  Patient states last night she had some chips and about an hour later started experiencing severe epigastric and right upper quadrant abdominal pain radiating to her back.  States she was pregnant and delivered her baby on May 31 and since then she has had similar pain 3 times.  States she was evaluated previously and was told her symptoms were due to acid reflux and she was given a medication which she is no longer taking.  Denies any GERD symptoms.  Denies fevers, nausea, or vomiting.  Review of Systems:  All systems reviewed and apart from history of presenting illness, are negative.  Past Medical History:  Diagnosis Date   Pregnancy     History reviewed. No pertinent surgical history.   reports that she has never smoked. She has never used smokeless tobacco. She reports current alcohol use. She reports that she does not use drugs.  No Known Allergies  History reviewed. No pertinent family history.  Prior to Admission medications   Medication Sig Start  Date End Date Taking? Authorizing Provider  famotidine (PEPCID) 20 MG tablet Take 1 tablet (20 mg total) by mouth 2 (two) times daily. 07/26/20 08/25/20  Tanda Rockers, PA-C  ondansetron (ZOFRAN) 4 MG tablet Take 1 tablet (4 mg total) by mouth every 6 (six) hours as needed for nausea or vomiting. 08/01/17   Loren Racer, MD    Physical Exam: Vitals:   08/27/20 0015 08/27/20 0300 08/27/20 0330 08/27/20 0400  BP: 122/85 105/74 111/70 110/75  Pulse: 64 66 63 60  Resp: 16     Temp: 98.4 F (36.9 C)     TempSrc: Oral     SpO2: 100% 100% 100% 100%  Weight:      Height:        Physical Exam Constitutional:      General: She is not in acute distress. HENT:     Head: Normocephalic and atraumatic.  Eyes:     Extraocular Movements: Extraocular movements intact.     Conjunctiva/sclera: Conjunctivae normal.  Cardiovascular:     Rate and Rhythm: Normal rate and regular rhythm.     Pulses: Normal pulses.  Pulmonary:     Effort: Pulmonary effort is normal. No respiratory distress.     Breath sounds: Normal breath sounds. No wheezing or rales.  Abdominal:     General: Bowel sounds are normal. There is no distension.     Palpations: Abdomen is soft.     Tenderness: There is no abdominal tenderness. There is no guarding or rebound.  Musculoskeletal:        General: No swelling or tenderness.  Cervical back: Normal range of motion and neck supple.  Skin:    General: Skin is warm and dry.  Neurological:     General: No focal deficit present.     Mental Status: She is alert and oriented to person, place, and time.     Labs on Admission: I have personally reviewed following labs and imaging studies  CBC: Recent Labs  Lab 08/26/20 2236  WBC 8.9  NEUTROABS 7.4  HGB 12.2  HCT 36.9  MCV 95.6  PLT 331   Basic Metabolic Panel: Recent Labs  Lab 08/26/20 2236  NA 137  K 3.4*  CL 101  CO2 28  GLUCOSE 92  BUN 13  CREATININE 0.80  CALCIUM 9.2   GFR: Estimated Creatinine  Clearance: 93.9 mL/min (by C-G formula based on SCr of 0.8 mg/dL). Liver Function Tests: Recent Labs  Lab 08/26/20 2236  AST 219*  ALT 76*  ALKPHOS 86  BILITOT 3.8*  PROT 8.0  ALBUMIN 4.4   Recent Labs  Lab 08/26/20 2236  LIPASE 36   No results for input(s): AMMONIA in the last 168 hours. Coagulation Profile: No results for input(s): INR, PROTIME in the last 168 hours. Cardiac Enzymes: No results for input(s): CKTOTAL, CKMB, CKMBINDEX, TROPONINI in the last 168 hours. BNP (last 3 results) No results for input(s): PROBNP in the last 8760 hours. HbA1C: No results for input(s): HGBA1C in the last 72 hours. CBG: No results for input(s): GLUCAP in the last 168 hours. Lipid Profile: No results for input(s): CHOL, HDL, LDLCALC, TRIG, CHOLHDL, LDLDIRECT in the last 72 hours. Thyroid Function Tests: No results for input(s): TSH, T4TOTAL, FREET4, T3FREE, THYROIDAB in the last 72 hours. Anemia Panel: No results for input(s): VITAMINB12, FOLATE, FERRITIN, TIBC, IRON, RETICCTPCT in the last 72 hours. Urine analysis:    Component Value Date/Time   COLORURINE YELLOW 08/01/2017 0931   APPEARANCEUR CLEAR 08/01/2017 0931   LABSPEC <1.005 (L) 08/01/2017 0931   PHURINE 7.0 08/01/2017 0931   GLUCOSEU NEGATIVE 08/01/2017 0931   HGBUR NEGATIVE 08/01/2017 0931   BILIRUBINUR NEGATIVE 08/01/2017 0931   KETONESUR NEGATIVE 08/01/2017 0931   PROTEINUR NEGATIVE 08/01/2017 0931   NITRITE NEGATIVE 08/01/2017 0931   LEUKOCYTESUR NEGATIVE 08/01/2017 0931    Radiological Exams on Admission: DG Chest 2 View  Result Date: 08/26/2020 CLINICAL DATA:  Chest pain EXAM: CHEST - 2 VIEW COMPARISON:  None. FINDINGS: The heart size and mediastinal contours are within normal limits. Both lungs are clear. The visualized skeletal structures are unremarkable. IMPRESSION: No active cardiopulmonary disease. Electronically Signed   By: Helyn Numbers M.D.   On: 08/26/2020 22:47   CT ABDOMEN PELVIS W  CONTRAST  Result Date: 08/27/2020 CLINICAL DATA:  Acute hepatitis EXAM: CT ABDOMEN AND PELVIS WITH CONTRAST TECHNIQUE: Multidetector CT imaging of the abdomen and pelvis was performed using the standard protocol following bolus administration of intravenous contrast. CONTRAST:  18mL OMNIPAQUE IOHEXOL 350 MG/ML SOLN COMPARISON:  None. FINDINGS: Lower chest: The visualized lung bases are clear. The visualized heart and pericardium are unremarkable. Hepatobiliary: There is mild periportal edema. The gallbladder is distended and there is mild dilation of the a extrahepatic bile duct which measures 8-9 mm in diameter, similar to prior sonogram of 08/27/2020. No pericholecystic inflammatory change. No enhancing intrahepatic mass identified. The hepatic vasculature is patent. Pancreas: Unremarkable Spleen: Unremarkable Adrenals/Urinary Tract: Adrenal glands are unremarkable. Kidneys are normal, without renal calculi, focal lesion, or hydronephrosis. Bladder is unremarkable. Stomach/Bowel: Moderate stool seen throughout the colon. Stomach  is within normal limits. Appendix appears normal. No evidence of bowel wall thickening, distention, or inflammatory changes. No free intraperitoneal gas or fluid. Vascular/Lymphatic: No significant vascular findings are present. No enlarged abdominal or pelvic lymph nodes. Reproductive: Uterus and bilateral adnexa are unremarkable. Other: No abdominal wall hernia.  Rectum unremarkable. Musculoskeletal: No acute bone abnormality. No lytic or blastic bone lesion identified. IMPRESSION: Mild dilation of the biliary tree, similar to that noted on recent sonogram. Though a distal obstructing lesion is not clearly identified, the distal duct is not optimally assessed with this technique. If there is clinical suspicion of a distal obstructing process, such as choledocholithiasis, ERCP or MRCP examination may be more helpful for further evaluation. Mild periportal edema, nonspecific. Moderate  stool seen throughout the colon without evidence of obstruction. Electronically Signed   By: Helyn Numbers M.D.   On: 08/27/2020 03:22   US Abdomen Limited  Result Date: 08/27/2020 CLINICAL DATA:  Right upper quadrant pain EXAM: ULTRASOUND ABDOMEN LIMITED RIGHT UPPER QUADRANT COMPARISON:  None. FINDINGS: Gallbladder: Very small amount of sludge in the gallbladder. A negative sonographic Eulah Pont sign was reported by the sonographer. No gallbladder wall thickening or pericholecystic fluid. Common bile duct: Diameter: 9 mm Liver: No focal lesion identified. Within normal limits in parenchymal echogenicity. Portal vein is patent on color Doppler imaging with normal direction of blood flow towards the liver. Other: None. IMPRESSION: Gallbladder sludge without other evidence of acute cholecystitis. Electronically Signed   By: Deatra Robinson M.D.   On: 08/27/2020 01:45    EKG: Independently reviewed.  Sinus rhythm, nonspecific T wave abnormalities.  No significant change compared to prior tracings.  Assessment/Plan Principal Problem:   Choledocholithiasis Active Problems:   Hypokalemia   Epigastric/right upper quadrant abdominal pain with elevated liver enzymes Suspected choledocholithiasis Patient is presenting with complaints of epigastric and right upper quadrant abdominal pain.  Labs showing elevated liver enzymes (AST 219, ALT 76, T bili 3.8) with normal lipase. Right upper quadrant ultrasound revealed gallbladder sludge without evidence of acute cholecystitis.  CT scan showing mild dilation of biliary tree without obvious obstructing lesion. Cholangitis less likely given no fever or leukocytosis. -Keep n.p.o. MRCP ordered.  Pain management.  ED physician sent a secure chat message to on-call physician for [facility] requesting consultation in the morning.  Mild hypokalemia Potassium 3.4. -Replace potassium and check magnesium level  DVT prophylaxis: SCDs Code Status: Full code Family  Communication: No family available at this time. Disposition Plan: Status is: Observation  The patient remains OBS appropriate and will d/c before 2 midnights.  Dispo: The patient is from: Home              Anticipated d/c is to: Home              Patient currently is not medically stable to d/c.   Difficult to place patient No  Level of care: Level of care: Med-Surg  The medical decision making on this patient was of high complexity and the patient is at high risk for clinical deterioration, therefore this is a level 3 visit.  John Giovanni MD [facility]  If 7PM-7AM, please contact night-coverage [email or web address]  08/27/2020, 6:03 AM

## 2020-08-27 NOTE — Progress Notes (Addendum)
PROGRESS NOTE   Cynthia Avery  TKZ:[id number]    DOB: 05-04-1995    DOA: 08/26/2020  PCP: Pcp, No   I have briefly reviewed patients previous medical records in [facility].  Chief Complaint  Patient presents with   Chest Pain    Brief Narrative:  25 year old female with PMH of GERD presented to the ED with complaints of epigastric and RUQ pain.  Initial labs showed AST 219, ALT 76, total bilirubin 3.8.  RUQ ultrasound showed gallbladder sludge without evidence of acute cholecystitis.  CT scan showed mild dilatation of biliary tree without obvious obstructing lesion.  [facility] was consulted , MRCP which now shows diminished biliary ductal dilatation and distention of the gallbladder compared to prior study and the recommend general surgery consultation for laparoscopic cholecystectomy with IOC.   Assessment & Plan:  Principal Problem:   Choledocholithiasis Active Problems:   Hypokalemia   RUQ/epigastric pain with abnormal LFTs, suspecting symptomatic cholelithiasis/choledocholithiasis with passed stone: - Initial labs: Alkaline phosphatase 86, AST 219, ALT 76, total bilirubin 3.8 and lipase 36.  LFTs have worsened the next day, AST 616, ALT 453, total bilirubin 2.8, indirect bilirubin 3.6 and total bilirubin 6.4. - RUQ ultrasound: Gallbladder sludge without other evidence of acute cholecystitis. - CT abdomen and pelvis: IMPRESSION: Mild dilation of the biliary tree, similar to that noted on recent sonogram. Distal obstructing lesion is not clearly identified. - MRCP: Diminished biliary ductal dilatation and distention of the GB compared to prior study, might indicate recent passage of a large egregiously calculus.  Suspected small intraductal calculus in the CBD at 3 mm.  Cholelithiasis. - GI consultation appreciated.  Communicated with Dr. Salley [facility] who recommends general surgery consultation for laparoscopic cholecystectomy with IOC.  Does not see indication for ERCP.   Feels that the larger stone may have passed. -Consulted general surgery/Dr. Marin Olp: Recommends ERCP prior to consideration for laparoscopic cholecystectomy. ERCP likely 8/22.  Updated all of this plan to patient just now. -Follow LFTs in a.m.   Hypokalemia Replace and follow.  Constipation Aggressive bowel regimen.  Body mass index is 20.59 kg/m.     DVT prophylaxis: SCDs Start: 08/27/20 0544     Code Status: Full Code Family Communication: None at bedside. Disposition:  Status is: Observation  The patient remains OBS appropriate and will d/c before 2 midnights.  Dispo: The patient is from: Home              Anticipated d/c is to: Home              Patient currently is not medically stable to d/c.   Difficult to place patient No        Consultants:   [facility] General surgery  Procedures:   None  Antimicrobials:    Anti-infectives (From admission, onward)    None         Subjective:  Seen earlier this morning.  Reports no abdominal pain since approximately 4 PM after pain meds.  No nausea or vomiting.  Denies prior history of gallstones.  Objective:   Vitals:   08/27/20 0300 08/27/20 0330 08/27/20 0400 08/27/20 0640  BP: 105/74 111/70 110/75 113/90  Pulse: 66 63 60 (!) 57  Resp:    16  Temp:    98.5 F (36.9 C)  TempSrc:    Oral  SpO2: 100% 100% 100% 100%  Weight:      Height:        General exam: Pleasant  young female, well-built and nourished lying comfortably propped up in bed without distress.  Oral mucosa moist. Respiratory system: Clear to auscultation. Respiratory effort normal. Cardiovascular system: S1 & S2 heard, RRR. No JVD, murmurs, rubs, gallops or clicks. No pedal edema. Gastrointestinal system: Abdomen is nondistended, soft and nontender. No organomegaly or masses felt. Normal bowel sounds heard. Central nervous system: Alert and oriented. No focal neurological deficits. Extremities: Symmetric 5 x 5 power. Skin: No  rashes, lesions or ulcers Psychiatry: Judgement and insight appear normal. Mood & affect appropriate.     Data Reviewed:   I have personally reviewed following labs and imaging studies   CBC: Recent Labs  Lab 08/26/20 2236  WBC 8.9  NEUTROABS 7.4  HGB 12.2  HCT 36.9  MCV 95.6  PLT 331    Basic Metabolic Panel: Recent Labs  Lab 08/26/20 2236  NA 137  K 3.4*  CL 101  CO2 28  GLUCOSE 92  BUN 13  CREATININE 0.80  CALCIUM 9.2    Liver Function Tests: Recent Labs  Lab 08/26/20 2236 08/27/20 1112  AST 219* 616*  ALT 76* 453*  ALKPHOS 86 124  BILITOT 3.8* 6.4*  PROT 8.0 7.6  ALBUMIN 4.4 4.1    CBG: No results for input(s): GLUCAP in the last 168 hours.  Microbiology Studies:   Recent Results (from the past 240 hour(s))  Resp Panel by RT-PCR (Flu A&B, Covid) Nasopharyngeal Swab     Status: None   Collection Time: 08/27/20  4:20 AM   Specimen: Nasopharyngeal Swab; Nasopharyngeal(NP) swabs in vial transport medium  Result Value Ref Range Status   SARS Coronavirus 2 by RT PCR NEGATIVE NEGATIVE Final    Comment: (NOTE) SARS-CoV-2 target nucleic acids are NOT DETECTED.  The SARS-CoV-2 RNA is generally detectable in upper respiratory specimens during the acute phase of infection. The lowest concentration of SARS-CoV-2 viral copies this assay can detect is 138 copies/mL. A negative result does not preclude SARS-Cov-2 infection and should not be used as the sole basis for treatment or other patient management decisions. A negative result may occur with  improper specimen collection/handling, submission of specimen other than nasopharyngeal swab, presence of viral mutation(s) within the areas targeted by this assay, and inadequate number of viral copies(<138 copies/mL). A negative result must be combined with clinical observations, patient history, and epidemiological information. The expected result is Negative.  Fact Sheet for Patients:   BloggerCourse.com  Fact Sheet for Healthcare Providers:  SeriousBroker.it  This test is no t yet approved or cleared by the [place] FDA and  has been authorized for detection and/or diagnosis of SARS-CoV-2 by FDA under an Emergency Use Authorization (EUA). This EUA will remain  in effect (meaning this test can be used) for the duration of the COVID-19 declaration under Section 564(b)(1) of the Act, 21 U.S.C.section 360bbb-3(b)(1), unless the authorization is terminated  or revoked sooner.       Influenza A by PCR NEGATIVE NEGATIVE Final   Influenza B by PCR NEGATIVE NEGATIVE Final    Comment: (NOTE) The Xpert Xpress SARS-CoV-2/FLU/RSV plus assay is intended as an aid in the diagnosis of influenza from Nasopharyngeal swab specimens and should not be used as a sole basis for treatment. Nasal washings and aspirates are unacceptable for Xpert Xpress SARS-CoV-2/FLU/RSV testing.  Fact Sheet for Patients: BloggerCourse.com  Fact Sheet for Healthcare Providers: SeriousBroker.it  This test is not yet approved or cleared by the [place] FDA and has been authorized for detection and/or  diagnosis of SARS-CoV-2 by FDA under an Emergency Use Authorization (EUA). This EUA will remain in effect (meaning this test can be used) for the duration of the COVID-19 declaration under Section 564(b)(1) of the Act, 21 U.S.C. section 360bbb-3(b)(1), unless the authorization is terminated or revoked.  Performed at [facility], [address], [place], [place] [address]      Radiology Studies:  DG Chest 2 View  Result Date: 08/26/2020 CLINICAL DATA:  Chest pain EXAM: CHEST - 2 VIEW COMPARISON:  None. FINDINGS: The heart size and mediastinal contours are within normal limits. Both lungs are clear. The visualized skeletal structures are unremarkable. IMPRESSION: No active  cardiopulmonary disease. Electronically Signed   By: Helyn Numbers M.D.   On: 08/26/2020 22:47   CT ABDOMEN PELVIS W CONTRAST  Result Date: 08/27/2020 CLINICAL DATA:  Acute hepatitis EXAM: CT ABDOMEN AND PELVIS WITH CONTRAST TECHNIQUE: Multidetector CT imaging of the abdomen and pelvis was performed using the standard protocol following bolus administration of intravenous contrast. CONTRAST:  75mL OMNIPAQUE IOHEXOL 350 MG/ML SOLN COMPARISON:  None. FINDINGS: Lower chest: The visualized lung bases are clear. The visualized heart and pericardium are unremarkable. Hepatobiliary: There is mild periportal edema. The gallbladder is distended and there is mild dilation of the a extrahepatic bile duct which measures 8-9 mm in diameter, similar to prior sonogram of 08/27/2020. No pericholecystic inflammatory change. No enhancing intrahepatic mass identified. The hepatic vasculature is patent. Pancreas: Unremarkable Spleen: Unremarkable Adrenals/Urinary Tract: Adrenal glands are unremarkable. Kidneys are normal, without renal calculi, focal lesion, or hydronephrosis. Bladder is unremarkable. Stomach/Bowel: Moderate stool seen throughout the colon. Stomach is within normal limits. Appendix appears normal. No evidence of bowel wall thickening, distention, or inflammatory changes. No free intraperitoneal gas or fluid. Vascular/Lymphatic: No significant vascular findings are present. No enlarged abdominal or pelvic lymph nodes. Reproductive: Uterus and bilateral adnexa are unremarkable. Other: No abdominal wall hernia.  Rectum unremarkable. Musculoskeletal: No acute bone abnormality. No lytic or blastic bone lesion identified. IMPRESSION: Mild dilation of the biliary tree, similar to that noted on recent sonogram. Though a distal obstructing lesion is not clearly identified, the distal duct is not optimally assessed with this technique. If there is clinical suspicion of a distal obstructing process, such as  choledocholithiasis, ERCP or MRCP examination may be more helpful for further evaluation. Mild periportal edema, nonspecific. Moderate stool seen throughout the colon without evidence of obstruction. Electronically Signed   By: Helyn Numbers M.D.   On: 08/27/2020 03:22   MR 3D Recon At Scanner  Result Date: 08/27/2020 CLINICAL DATA:  Jaundice in a 25 year old female reportedly with acute hepatitis. EXAM: MRI ABDOMEN WITHOUT AND WITH CONTRAST (INCLUDING MRCP) TECHNIQUE: Multiplanar multisequence MR imaging of the abdomen was performed both before and after the administration of intravenous contrast. Heavily T2-weighted images of the biliary and pancreatic ducts were obtained, and three-dimensional MRCP images were rendered by post processing. CONTRAST:  30mL GADAVIST GADOBUTROL 1 MMOL/ML IV SOLN COMPARISON:  CT evaluation August 27, 2020. FINDINGS: Lower chest: Incidental imaging of the lung bases is unremarkable by MRI on limited assessment. Hepatobiliary: MRCP images are limited by respiratory motion. There is a potential 3 mm filling defect in the dependent common bile duct. There is evidence of cholelithiasis. Diminished biliary duct and gallbladder distension since the previous evaluation. Potential sludge layering dependently best seen on image 17 of series 3 as low signal material in the dependent CBD but without focal characteristics. Potential filling defect, approximately 3 mm,  seen on oblique reformatted images (image 191/102) common bile duct caliber now approximately 5 mm as compared to 8 mm. Diminished caliber of intrahepatic biliary ducts and decreased distension of the common bile duct. Diminished periportal edema. No focal, suspicious hepatic lesion. Pancreas: No sign of pancreatic edema. Normal intrinsic T1 signal in the pancreas. Pancreatic duct is nondilated. Spleen:  Normal spleen. Adrenals/Urinary Tract:  Adrenal glands are normal. Symmetric renal enhancement.  No hydronephrosis.  Stomach/Bowel: Gastrointestinal tract is unremarkable to the extent evaluated on abdominal MRI. Vascular/Lymphatic: Vascular structures in the abdomen are patent. There is no gastrohepatic or hepatoduodenal ligament lymphadenopathy. No retroperitoneal or mesenteric lymphadenopathy. Other:  No ascites. Musculoskeletal: No suspicious bone lesions identified. IMPRESSION: 1. Diminished biliary duct dilation and distension of the gallbladder compared to the prior study. Correlate with any improvement in symptoms that might indicate recent passage of a larger biliary calculus. 2. Suspected small intraductal calculus in the common bile duct at 3 mm though with limited assessment due to motion artifact on MRCP sequences, perhaps with a small amount of adjacent sludge. 3. Cholelithiasis. 4. Diminished periportal edema. 5. No sign of pancreatic edema. These results will be called to the ordering clinician or representative by the Radiologist Assistant, and communication documented in the PACS or [employer]. Electronically Signed   By: Donzetta Kohut M.D.   On: 08/27/2020 11:32   US Abdomen Limited  Result Date: 08/27/2020 CLINICAL DATA:  Right upper quadrant pain EXAM: ULTRASOUND ABDOMEN LIMITED RIGHT UPPER QUADRANT COMPARISON:  None. FINDINGS: Gallbladder: Very small amount of sludge in the gallbladder. A negative sonographic Eulah Pont sign was reported by the sonographer. No gallbladder wall thickening or pericholecystic fluid. Common bile duct: Diameter: 9 mm Liver: No focal lesion identified. Within normal limits in parenchymal echogenicity. Portal vein is patent on color Doppler imaging with normal direction of blood flow towards the liver. Other: None. IMPRESSION: Gallbladder sludge without other evidence of acute cholecystitis. Electronically Signed   By: Deatra Robinson M.D.   On: 08/27/2020 01:45   MR ABDOMEN MRCP W WO CONTAST  Result Date: 08/27/2020 CLINICAL DATA:  Jaundice in a 25 year old female  reportedly with acute hepatitis. EXAM: MRI ABDOMEN WITHOUT AND WITH CONTRAST (INCLUDING MRCP) TECHNIQUE: Multiplanar multisequence MR imaging of the abdomen was performed both before and after the administration of intravenous contrast. Heavily T2-weighted images of the biliary and pancreatic ducts were obtained, and three-dimensional MRCP images were rendered by post processing. CONTRAST:  46mL GADAVIST GADOBUTROL 1 MMOL/ML IV SOLN COMPARISON:  CT evaluation August 27, 2020. FINDINGS: Lower chest: Incidental imaging of the lung bases is unremarkable by MRI on limited assessment. Hepatobiliary: MRCP images are limited by respiratory motion. There is a potential 3 mm filling defect in the dependent common bile duct. There is evidence of cholelithiasis. Diminished biliary duct and gallbladder distension since the previous evaluation. Potential sludge layering dependently best seen on image 17 of series 3 as low signal material in the dependent CBD but without focal characteristics. Potential filling defect, approximately 3 mm, seen on oblique reformatted images (image 191/102) common bile duct caliber now approximately 5 mm as compared to 8 mm. Diminished caliber of intrahepatic biliary ducts and decreased distension of the common bile duct. Diminished periportal edema. No focal, suspicious hepatic lesion. Pancreas: No sign of pancreatic edema. Normal intrinsic T1 signal in the pancreas. Pancreatic duct is nondilated. Spleen:  Normal spleen. Adrenals/Urinary Tract:  Adrenal glands are normal. Symmetric renal enhancement.  No hydronephrosis. Stomach/Bowel: Gastrointestinal tract is unremarkable to  the extent evaluated on abdominal MRI. Vascular/Lymphatic: Vascular structures in the abdomen are patent. There is no gastrohepatic or hepatoduodenal ligament lymphadenopathy. No retroperitoneal or mesenteric lymphadenopathy. Other:  No ascites. Musculoskeletal: No suspicious bone lesions identified. IMPRESSION: 1. Diminished  biliary duct dilation and distension of the gallbladder compared to the prior study. Correlate with any improvement in symptoms that might indicate recent passage of a larger biliary calculus. 2. Suspected small intraductal calculus in the common bile duct at 3 mm though with limited assessment due to motion artifact on MRCP sequences, perhaps with a small amount of adjacent sludge. 3. Cholelithiasis. 4. Diminished periportal edema. 5. No sign of pancreatic edema. These results will be called to the ordering clinician or representative by the Radiologist Assistant, and communication documented in the PACS or [employer]. Electronically Signed   By: Donzetta KohutGeoffrey  Wile M.D.   On: 08/27/2020 11:32     Scheduled Meds:    Continuous Infusions:     LOS: 0 days     Marcellus ScottAnand Valmore Arabie, MD, [place]ACP, [facility]. [facility]    To contact the attending provider between 7A-7P or the covering provider during after hours 7P-7A, please log into the web site [email or web address] and access using universal [place] password for that web site. If you do not have the password, please call the hospital operator.  08/27/2020, 1:39 PM

## 2020-08-27 NOTE — Progress Notes (Signed)
    Chart reviewed.  Spoke with Dr. Georgiann Cocker.  I think she likely passed her stone but admittedly do not know.  Given no current sxs,  decline in size of CBD and "potential" CBD stone I will not commit to an ERCP at this time.  Let us see how she does and recheck LFT's. If she remains Asx and LFT's go down my recommendation would be to do lap chole and be available for ERCP after if IOC +.  If LFTs remain up significantly and/or pain returns then ERCP will be next step most likely.  I made  NPO in AM and will check back in then.  Iva Boop, MD, [facility] 08/27/2020 3:34 PM [phone]

## 2020-08-28 ENCOUNTER — Observation Stay (HOSPITAL_COMMUNITY): Payer: 59 | Admitting: Certified Registered"

## 2020-08-28 ENCOUNTER — Encounter (HOSPITAL_COMMUNITY): Payer: Self-pay | Admitting: Internal Medicine

## 2020-08-28 ENCOUNTER — Observation Stay (HOSPITAL_COMMUNITY): Payer: 59

## 2020-08-28 ENCOUNTER — Encounter (HOSPITAL_COMMUNITY)
Admission: EM | Disposition: A | Discharge status: Home / Self Care | Payer: Self-pay | Source: Home / Self Care | Attending: Family Medicine

## 2020-08-28 DIAGNOSIS — R7989 Other specified abnormal findings of blood chemistry: Secondary | ICD-10-CM | POA: Diagnosis not present

## 2020-08-28 DIAGNOSIS — K8050 Calculus of bile duct without cholangitis or cholecystitis without obstruction: Secondary | ICD-10-CM | POA: Diagnosis not present

## 2020-08-28 DIAGNOSIS — R1013 Epigastric pain: Secondary | ICD-10-CM

## 2020-08-28 DIAGNOSIS — E876 Hypokalemia: Secondary | ICD-10-CM

## 2020-08-28 HISTORY — PX: ERCP: SHX5425

## 2020-08-28 HISTORY — PX: SPHINCTEROTOMY: SHX5544

## 2020-08-28 LAB — COMPREHENSIVE METABOLIC PANEL
ALT: 329 U/L — ABNORMAL HIGH (ref 0–44)
AST: 244 U/L — ABNORMAL HIGH (ref 15–41)
Albumin: 3.8 g/dL (ref 3.5–5.0)
Alkaline Phosphatase: 130 U/L — ABNORMAL HIGH (ref 38–126)
Anion gap: 5 (ref 5–15)
BUN: 8 mg/dL (ref 6–20)
CO2: 28 mmol/L (ref 22–32)
Calcium: 9.5 mg/dL (ref 8.9–10.3)
Chloride: 105 mmol/L (ref 98–111)
Creatinine, Ser: 0.75 mg/dL (ref 0.44–1.00)
GFR, Estimated: >60 mL/min (ref >60)
Glucose, Bld: 78 mg/dL (ref 70–99)
Potassium: 4 mmol/L (ref 3.5–5.1)
Sodium: 138 mmol/L (ref 135–145)
Total Bilirubin: 5.6 mg/dL — ABNORMAL HIGH (ref 0.3–1.2)
Total Protein: 6.9 g/dL (ref 6.5–8.1)

## 2020-08-28 LAB — LIPASE, BLOOD: Lipase: 25 U/L (ref 11–51)

## 2020-08-28 SURGERY — ERCP, WITH INTERVENTION IF INDICATED
Anesthesia: General

## 2020-08-28 MED ORDER — PROPOFOL 10 MG/ML IV BOLUS
INTRAVENOUS | Status: DC | PRN
  Administered 2020-08-28: 120 mg via INTRAVENOUS

## 2020-08-28 MED ORDER — INDOMETHACIN 50 MG RE SUPP
RECTAL | Status: DC | PRN
  Administered 2020-08-28: 100 mg via RECTAL

## 2020-08-28 MED ORDER — MIDAZOLAM HCL 2 MG/2ML IJ SOLN
INTRAMUSCULAR | Status: AC
  Filled 2020-08-28: qty 2

## 2020-08-28 MED ORDER — ONDANSETRON HCL 4 MG/2ML IJ SOLN
INTRAMUSCULAR | Status: DC | PRN
  Administered 2020-08-28: 4 mg via INTRAVENOUS

## 2020-08-28 MED ORDER — ROCURONIUM BROMIDE 10 MG/ML (PF) SYRINGE
PREFILLED_SYRINGE | INTRAVENOUS | Status: DC | PRN
  Administered 2020-08-28: 40 mg via INTRAVENOUS

## 2020-08-28 MED ORDER — SODIUM CHLORIDE 0.9 % IV SOLN
INTRAVENOUS | Status: DC | PRN
  Administered 2020-08-28: 15 mL

## 2020-08-28 MED ORDER — FENTANYL CITRATE (PF) 100 MCG/2ML IJ SOLN: 25.0000 ug | INTRAMUSCULAR | Status: DC | PRN

## 2020-08-28 MED ORDER — INDOMETHACIN 50 MG RE SUPP
RECTAL | Status: AC
  Filled 2020-08-28: qty 2

## 2020-08-28 MED ORDER — TRAMADOL HCL 50 MG PO TABS
50.0000 mg | ORAL_TABLET | Freq: Once | ORAL | Status: AC
  Administered 2020-08-28: 50 mg via ORAL
  Filled 2020-08-28: qty 1

## 2020-08-28 MED ORDER — MIDAZOLAM HCL 5 MG/5ML IJ SOLN
INTRAMUSCULAR | Status: DC | PRN
  Administered 2020-08-28: 2 mg via INTRAVENOUS

## 2020-08-28 MED ORDER — SODIUM CHLORIDE 0.9 % IV SOLN: INTRAVENOUS | Status: DC

## 2020-08-28 MED ORDER — FENTANYL CITRATE (PF) 100 MCG/2ML IJ SOLN
INTRAMUSCULAR | Status: DC | PRN
  Administered 2020-08-28 (×2): 50 ug via INTRAVENOUS

## 2020-08-28 MED ORDER — SUGAMMADEX SODIUM 200 MG/2ML IV SOLN
INTRAVENOUS | Status: DC | PRN
  Administered 2020-08-28: 120 mg via INTRAVENOUS

## 2020-08-28 MED ORDER — LIDOCAINE 2% (20 MG/ML) 5 ML SYRINGE
INTRAMUSCULAR | Status: DC | PRN
  Administered 2020-08-28: 60 mg via INTRAVENOUS

## 2020-08-28 MED ORDER — PROMETHAZINE HCL 25 MG/ML IJ SOLN
6.2500 mg | INTRAMUSCULAR | Status: DC | PRN
Start: 2020-08-28 — End: 2020-08-29

## 2020-08-28 MED ORDER — FENTANYL CITRATE (PF) 100 MCG/2ML IJ SOLN
INTRAMUSCULAR | Status: AC
  Filled 2020-08-28: qty 2

## 2020-08-28 MED ORDER — DEXAMETHASONE SODIUM PHOSPHATE 10 MG/ML IJ SOLN
INTRAMUSCULAR | Status: DC | PRN
  Administered 2020-08-28: 10 mg via INTRAVENOUS

## 2020-08-28 MED ORDER — PIPERACILLIN-TAZOBACTAM 3.375 G IVPB 30 MIN
3.3750 g | Freq: Once | INTRAVENOUS | Status: AC
  Administered 2020-08-28: 3.375 g via INTRAVENOUS
  Filled 2020-08-28 (×2): qty 50

## 2020-08-28 MED ORDER — ONDANSETRON HCL 4 MG/2ML IJ SOLN
4.0000 mg | Freq: Three times a day (TID) | INTRAMUSCULAR | Status: DC | PRN
  Administered 2020-08-29: 4 mg via INTRAVENOUS
  Filled 2020-08-28: qty 2

## 2020-08-28 MED ORDER — PROPOFOL 10 MG/ML IV BOLUS
INTRAVENOUS | Status: AC
  Filled 2020-08-28: qty 20

## 2020-08-28 MED ORDER — LACTATED RINGERS IV SOLN
INTRAVENOUS | Status: DC | PRN
Start: 2020-08-28 — End: 2020-08-28

## 2020-08-28 MED ORDER — GLUCAGON HCL RDNA (DIAGNOSTIC) 1 MG IJ SOLR
INTRAMUSCULAR | Status: AC
  Filled 2020-08-28: qty 1

## 2020-08-28 NOTE — Anesthesia Preprocedure Evaluation (Signed)
Anesthesia Evaluation    Reviewed: Allergy & Precautions, Patient's Chart, lab work & pertinent test results  History of Anesthesia Complications Negative for: history of anesthetic complications  Airway Mallampati: II  TM Distance: >3 FB Neck ROM: Full    Dental  (+) Missing,    Pulmonary neg pulmonary ROS,    Pulmonary exam normal        Cardiovascular negative cardio ROS Normal cardiovascular exam     Neuro/Psych negative neurological ROS  negative psych ROS   GI/Hepatic Neg liver ROS, CBD stone, acute hepatitis   Endo/Other  negative endocrine ROS  Renal/GU negative Renal ROS  negative genitourinary   Musculoskeletal negative musculoskeletal ROS (+)   Abdominal   Peds  Hematology negative hematology ROS (+)   Anesthesia Other Findings Day of surgery medications reviewed with patient.  Reproductive/Obstetrics negative OB ROS                             Anesthesia Physical Anesthesia Plan  ASA: 2  Anesthesia Plan: General   Post-op Pain Management:    Induction: Intravenous  PONV Risk Score and Plan: 3 and Treatment may vary due to age or medical condition, Midazolam and Dexamethasone  Airway Management Planned: Oral ETT  Additional Equipment: None  Intra-op Plan:   Post-operative Plan: Extubation in OR  Informed Consent: I have reviewed the patients History and Physical, chart, labs and discussed the procedure including the risks, benefits and alternatives for the proposed anesthesia with the patient or authorized representative who has indicated his/her understanding and acceptance.     Dental advisory given  Plan Discussed with: CRNA  Anesthesia Plan Comments:         Anesthesia Quick Evaluation

## 2020-08-28 NOTE — Progress Notes (Signed)
The patient had an emesis episode and is requesting nausea medication. Messaged XBruna Potter

## 2020-08-28 NOTE — Progress Notes (Signed)
Patient requesting additional pain medication. Messaged XBruna Potter

## 2020-08-28 NOTE — Progress Notes (Signed)
[facility] Progress Note  Cynthia Avery 25 y.o. 11/09/1995  CC: Abdominal pain, abnormal LFTs, abnormal MRI   Subjective: Patient seen and examined at bedside.  Continues to deny abdominal pain.  Denies nausea and vomiting.  ROS : Afebrile, negative for chest pain   Objective: Vital signs in last 24 hours: Vitals:   08/28/20 0200 08/28/20 0552  BP: 110/68 110/71  Pulse: 62 61  Resp: 14 14  Temp: 98.4 F (36.9 C) 98.5 F (36.9 C)  SpO2: 100% 100%    Physical Exam:  General:  Alert, cooperative, no distress, appears stated age  Head:  Normocephalic, without obvious abnormality, atraumatic  Eyes:  Mild scleral icterus noted  Lungs:   Clear to auscultation bilaterally, respirations unlabored  Heart:  Regular rate and rhythm, S1, S2 normal  Abdomen:   Soft, non-tender, nondistended, bowel sounds present.  No peritoneal sign  Extremities: Extremities normal, atraumatic, no  edema  Pulses: 2+ and symmetric    Lab Results: Recent Labs    08/26/20 2236 08/27/20 1112 08/28/20 0354  NA 137  --  138  K 3.4*  --  4.0  CL 101  --  105  CO2 28  --  28  GLUCOSE 92  --  78  BUN 13  --  8  CREATININE 0.80  --  0.75  CALCIUM 9.2  --  9.5  MG  --  2.1  --    Recent Labs    08/27/20 1112 08/28/20 0354  AST 616* 244*  ALT 453* 329*  ALKPHOS 124 130*  BILITOT 6.4* 5.6*  PROT 7.6 6.9  ALBUMIN 4.1 3.8   Recent Labs    08/26/20 2236  WBC 8.9  NEUTROABS 7.4  HGB 12.2  HCT 36.9  MCV 95.6  PLT 331   No results for input(s): LABPROT, INR in the last 72 hours.    Assessment/Plan: -Intermittent right upper quadrant and epigastric pain since last few months.  MRI MRCP yesterday showed questionable 3 mm stone versus sludge in the CBD.  -Abnormal LFTs -T bili 5.6 now  Recommendations ------------------------- -Discussed with Dr. Gassner.  Because of persistent elevated T bili, plan for ERCP today. -She will benefit from cholecystectomy prior to  discharge -Keep n.p.o. for now -Preop Zosyn ordered -Discussed with hospitalist and RN at bedside.  Risks (post ERCP pancreatitis, bleeding, infection, bowel perforation that could require surgery, sedation-related changes in cardiopulmonary systems), benefits (identification and possible treatment of source of symptoms, exclusion of certain causes of symptoms), and alternatives (watchful waiting, radiographic imaging studies, empiric medical treatment)  were explained to patient in detail and patient wishes to proceed.    Bernisha Verma MD, FACP 08/28/2020, 8:31 AM  Contact #  [phone]  

## 2020-08-28 NOTE — Op Note (Addendum)
[facility] Patient Name: Cynthia Avery Procedure Date: 08/28/2020 MRN: [id number] Attending MD: Iva Boop , MD Date of Birth: March 01, 1995 CSN: [id number] Age: 25 Admit Type: Inpatient Procedure:                ERCP Indications:              Evaluation and possible treatment of bile duct                            stone(s), Suspected bile duct stone(s) Providers:                Iva Boop, MD, Kandice Robinsons, Technician,                            Janae Sauce. Steele Berg, RN Referring MD:              Medicines:                General Anesthesia, Indomethacin 100 mg PR, Zosyn                            on call Complications:            No immediate complications. Estimated Blood Loss:     Estimated blood loss: none. Procedure:                Pre-Anesthesia Assessment:                           - Prior to the procedure, a History and Physical                            was performed, and patient medications and                            allergies were reviewed. The patient's tolerance of                            previous anesthesia was also reviewed. The risks                            and benefits of the procedure and the sedation                            options and risks were discussed with the patient.                            All questions were answered, and informed consent                            was obtained. Prior Anticoagulants: The patient has                            taken no previous anticoagulant or antiplatelet  agents. ASA Grade Assessment: II - A patient with                            mild systemic disease. After reviewing the risks                            and benefits, the patient was deemed in                            satisfactory condition to undergo the procedure.                           After obtaining informed consent, the scope was                            passed under direct vision. Throughout  the                            procedure, the patient's blood pressure, pulse, and                            oxygen saturations were monitored continuously. The                            TJF-Q180V ([id number] Olympus duodenoscope was                            introduced through the mouth, and used to inject                            contrast into and used to inject contrast into the                            bile duct. The patient tolerated the procedure                            well. The ERCP was somewhat difficult due to                            challenging cannulation because of abnormal anatomy. Scope In: Scope Out: Findings:      The scout film was normal. Esophagus not seen wll. Stomach normal but J       shaped Papilla looked slightly traumatized otherwise NL duodenum.      Difficult cannulation - wire into PD and not CBD - so left wire in PD       and then cannulated CBD with wire. PD wire removed. Injection showed CBD       7-8 MM. Gallbladeder filled. NL intrahepatics. I did not see stones but       clinical scenario indicated sphincterotomy and did so - balloon sweep       negative. Impression:               - The entire main bile duct was mildly dilated.                           -  No stone removed - probably had passed Moderate Sedation:      Not Applicable - Patient had care per Anesthesia. Recommendation:           - lap chole tomorrow if ok                           check labs in AM - I do check lipase - may be up                            some but if sig abd pain it is useful test                           clears today Procedure Code(s):        --- Professional ---                           [phone], Endoscopic retrograde                            cholangiopancreatography (ERCP); with                            sphincterotomy/papillotomy Diagnosis Code(s):        --- Professional ---                           K80.50, Calculus of bile duct without cholangitis                             or cholecystitis without obstruction CPT copyright 2019 [employer]. All rights reserved. The codes documented in this report are preliminary and upon coder review may  be revised to meet current compliance requirements. Iva Boop, MD 08/28/2020 10:32:21 AM This report has been signed electronically. Number of Addenda: 0

## 2020-08-28 NOTE — Interval H&P Note (Signed)
History and Physical Interval Note:  08/28/2020 9:15 AM  Cynthia Avery  has presented today for surgery, with the diagnosis of CBD stone.  The various methods of treatment have been discussed with the patient and family. After consideration of risks, benefits and other options for treatment, the patient has consented to  Procedure(s): ENDOSCOPIC RETROGRADE CHOLANGIOPANCREATOGRAPHY (ERCP) (N/A) as a surgical intervention.  The patient's history has been reviewed, patient examined, no change in status, stable for surgery.  I have reviewed the patient's chart and labs.  Questions were answered to the patient's satisfaction.     Stan Head

## 2020-08-28 NOTE — Transfer of Care (Signed)
Immediate Anesthesia Transfer of Care Note  Patient: Cynthia Avery  Procedure(s) Performed: ENDOSCOPIC RETROGRADE CHOLANGIOPANCREATOGRAPHY (ERCP)  Patient Location: PACU  Anesthesia Type:General  Level of Consciousness: awake, alert , oriented and patient cooperative  Airway & Oxygen Therapy: Patient Spontanous Breathing and Patient connected to face mask oxygen  Post-op Assessment: Report given to RN and Post -op Vital signs reviewed and stable  Post vital signs: Reviewed and stable  Last Vitals:  Vitals Value Taken Time  BP 117/70 08/28/20 1025  Temp    Pulse 71 08/28/20 1026  Resp 15 08/28/20 1026  SpO2 100 % 08/28/20 1026  Vitals shown include unvalidated device data.  Last Pain:  Vitals:   08/28/20 0914  TempSrc: Temporal  PainSc: 0-No pain         Complications: No notable events documented.

## 2020-08-28 NOTE — H&P (View-Only) (Signed)
[facility] Progress Note  Cynthia Avery 25 y.o. 1995/05/28  CC: Abdominal pain, abnormal LFTs, abnormal MRI   Subjective: Patient seen and examined at bedside.  Continues to deny abdominal pain.  Denies nausea and vomiting.  ROS : Afebrile, negative for chest pain   Objective: Vital signs in last 24 hours: Vitals:   08/28/20 0200 08/28/20 0552  BP: 110/68 110/71  Pulse: 62 61  Resp: 14 14  Temp: 98.4 F (36.9 C) 98.5 F (36.9 C)  SpO2: 100% 100%    Physical Exam:  General:  Alert, cooperative, no distress, appears stated age  Head:  Normocephalic, without obvious abnormality, atraumatic  Eyes:  Mild scleral icterus noted  Lungs:   Clear to auscultation bilaterally, respirations unlabored  Heart:  Regular rate and rhythm, S1, S2 normal  Abdomen:   Soft, non-tender, nondistended, bowel sounds present.  No peritoneal sign  Extremities: Extremities normal, atraumatic, no  edema  Pulses: 2+ and symmetric    Lab Results: Recent Labs    08/26/20 2236 08/27/20 1112 08/28/20 0354  NA 137  --  138  K 3.4*  --  4.0  CL 101  --  105  CO2 28  --  28  GLUCOSE 92  --  78  BUN 13  --  8  CREATININE 0.80  --  0.75  CALCIUM 9.2  --  9.5  MG  --  2.1  --    Recent Labs    08/27/20 1112 08/28/20 0354  AST 616* 244*  ALT 453* 329*  ALKPHOS 124 130*  BILITOT 6.4* 5.6*  PROT 7.6 6.9  ALBUMIN 4.1 3.8   Recent Labs    08/26/20 2236  WBC 8.9  NEUTROABS 7.4  HGB 12.2  HCT 36.9  MCV 95.6  PLT 331   No results for input(s): LABPROT, INR in the last 72 hours.    Assessment/Plan: -Intermittent right upper quadrant and epigastric pain since last few months.  MRI MRCP yesterday showed questionable 3 mm stone versus sludge in the CBD.  -Abnormal LFTs -T bili 5.6 now  Recommendations ------------------------- -Discussed with Dr. Concha Se.  Because of persistent elevated T bili, plan for ERCP today. -She will benefit from cholecystectomy prior to  discharge -Keep n.p.o. for now -Preop Zosyn ordered -Discussed with hospitalist and RN at bedside.  Risks (post ERCP pancreatitis, bleeding, infection, bowel perforation that could require surgery, sedation-related changes in cardiopulmonary systems), benefits (identification and possible treatment of source of symptoms, exclusion of certain causes of symptoms), and alternatives (watchful waiting, radiographic imaging studies, empiric medical treatment)  were explained to patient in detail and patient wishes to proceed.    Kathi Der MD, FACP 08/28/2020, 8:31 AM  Contact #  [phone]

## 2020-08-28 NOTE — Progress Notes (Signed)
[facility]  PROGRESS NOTE  Cynthia Avery DJM:[id number] DOB: 03-Dec-1995 DOA: 08/26/2020 PCP: Pcp, No   Brief HPI:   26 year old female with past medical history of GERD came to ED with complaints of epigastric and right upper quadrant pain.  Initial labs showed AST 219, ALT 76, total bilirubin was 3.8.  Right upper quadrant ultrasound showed gallbladder sludge without evidence of acute cholecystitis.  CT scan showed mild dilation of the biliary tree without obvious obstructing lesion.  [facility] was consulted.  MRCP showed diminished bilirubin dilatation and distention of the gallbladder compared to prior study and recommended general surgery consultation for laparoscopic cholecystectomy and IOC.  General surgery was consulted and they recommended getting ERCP before surgery.     Subjective   Patient seen and examined, seen by both [facility] as well as general surgery.  Plan for ERCP today for cholecystectomy.   Assessment/Plan:     Right upper quadrant/epigastric pain -Suspecting symptomatic cholelithiasis/choledocholithiasis with passed stone -Initial labs showed AST 219, ALT 76, total bili 3.8 lipase 36>> repeat labs next day showed AST 616, ALT 453, total bilirubin 6.4 -Right upper quadrant ultrasound showed gallbladder sludge without evidence of acute cholecystitis -CT abdomen pelvis showed mild dilation of the biliary tree.  Distal obstructing lesion not clearly defined. -MRCP showed diminished biliary ductal dilatation and distention of the gallbladder compared to prior study, might indicate recent passage of large egregiously calculus.  Suspected small intraductal calculus in the CBD at 3 mm. -Both [facility] and general surgery was consulted -General surgery recommended ERCP before cholecystectomy -Plan for ERCP today  Hypokalemia -Replete  Scheduled medications:    polyethylene glycol  17 g Oral Daily   senna  2 tablet Oral Daily         Data Reviewed:    CBG:  No results for input(s): GLUCAP in the last 168 hours.  SpO2: 99 % O2 Flow Rate (L/min): 6 L/min    Vitals:   08/28/20 1045 08/28/20 1100 08/28/20 1114 08/28/20 1215  BP: 112/72 115/71 111/64 104/65  Pulse: (!) 58 (!) 59 (!) 59 66  Resp: 14 14 18 17   Temp:  97.7 F (36.5 C) 98.8 F (37.1 C) 98.6 F (37 C)  TempSrc:   Oral Oral  SpO2: 99% 98% 100% 99%  Weight:      Height:         Intake/Output Summary (Last 24 hours) at 08/28/2020 1321 Last data filed at 08/28/2020 1100 Gross per 24 hour  Intake 494.82 ml  Output --  Net 494.82 ml    08/19 1901 - 08/21 0700 In: 480 [P.O.:480] Out: -   Filed Weights   08/26/20 2202  Weight: 54.4 kg    CBC:  Recent Labs  Lab 08/26/20 2236  WBC 8.9  HGB 12.2  HCT 36.9  PLT 331  MCV 95.6  MCH 31.6  MCHC 33.1  RDW 11.0*  LYMPHSABS 0.7  MONOABS 0.6  EOSABS 0.0  BASOSABS 0.0    Complete metabolic panel:  Recent Labs  Lab 08/26/20 2236 08/27/20 1112 08/28/20 0354  NA 137  --  138  K 3.4*  --  4.0  CL 101  --  105  CO2 28  --  28  GLUCOSE 92  --  78  BUN 13  --  8  CREATININE 0.80  --  0.75  CALCIUM 9.2  --  9.5  AST 219* 616* 244*  ALT 76* 453* 329*  ALKPHOS 86 124 130*  BILITOT 3.8* 6.4* 5.6*  ALBUMIN 4.4 4.1 3.8  MG  --  2.1  --     Recent Labs  Lab 08/26/20 2236 08/28/20 0354  LIPASE 36 25    Recent Labs  Lab 08/27/20 0420  SARSCOV2NAA NEGATIVE    ------------------------------------------------------------------------------------------------------------------ No results for input(s): CHOL, HDL, LDLCALC, TRIG, CHOLHDL, LDLDIRECT in the last 72 hours.  No results found for: HGBA1C ------------------------------------------------------------------------------------------------------------------ No results for input(s): TSH, T4TOTAL, T3FREE, THYROIDAB in the last 72 hours.  Invalid input(s):  FREET3 ------------------------------------------------------------------------------------------------------------------ No results for input(s): VITAMINB12, FOLATE, FERRITIN, TIBC, IRON, RETICCTPCT in the last 72 hours.  Coagulation profile No results for input(s): INR, PROTIME in the last 168 hours. No results for input(s): DDIMER in the last 72 hours.  Cardiac Enzymes No results for input(s): CKTOTAL, CKMB, CKMBINDEX, TROPONINI in the last 168 hours.  ------------------------------------------------------------------------------------------------------------------ No results found for: BNP   Antibiotics: Anti-infectives (From admission, onward)    Start     Dose/Rate Route Frequency Ordered Stop   08/28/20 0900  piperacillin-tazobactam (ZOSYN) IVPB 3.375 g        3.375 g 100 mL/hr over 30 Minutes Intravenous  Once 08/28/20 1093 08/28/20 0926        Radiology Reports  DG Chest 2 View  Result Date: 08/26/2020 CLINICAL DATA:  Chest pain EXAM: CHEST - 2 VIEW COMPARISON:  None. FINDINGS: The heart size and mediastinal contours are within normal limits. Both lungs are clear. The visualized skeletal structures are unremarkable. IMPRESSION: No active cardiopulmonary disease. Electronically Signed   By: Helyn Numbers M.D.   On: 08/26/2020 22:47   CT ABDOMEN PELVIS W CONTRAST  Result Date: 08/27/2020 CLINICAL DATA:  Acute hepatitis EXAM: CT ABDOMEN AND PELVIS WITH CONTRAST TECHNIQUE: Multidetector CT imaging of the abdomen and pelvis was performed using the standard protocol following bolus administration of intravenous contrast. CONTRAST:  20mL OMNIPAQUE IOHEXOL 350 MG/ML SOLN COMPARISON:  None. FINDINGS: Lower chest: The visualized lung bases are clear. The visualized heart and pericardium are unremarkable. Hepatobiliary: There is mild periportal edema. The gallbladder is distended and there is mild dilation of the a extrahepatic bile duct which measures 8-9 mm in diameter, similar to  prior sonogram of 08/27/2020. No pericholecystic inflammatory change. No enhancing intrahepatic mass identified. The hepatic vasculature is patent. Pancreas: Unremarkable Spleen: Unremarkable Adrenals/Urinary Tract: Adrenal glands are unremarkable. Kidneys are normal, without renal calculi, focal lesion, or hydronephrosis. Bladder is unremarkable. Stomach/Bowel: Moderate stool seen throughout the colon. Stomach is within normal limits. Appendix appears normal. No evidence of bowel wall thickening, distention, or inflammatory changes. No free intraperitoneal gas or fluid. Vascular/Lymphatic: No significant vascular findings are present. No enlarged abdominal or pelvic lymph nodes. Reproductive: Uterus and bilateral adnexa are unremarkable. Other: No abdominal wall hernia.  Rectum unremarkable. Musculoskeletal: No acute bone abnormality. No lytic or blastic bone lesion identified. IMPRESSION: Mild dilation of the biliary tree, similar to that noted on recent sonogram. Though a distal obstructing lesion is not clearly identified, the distal duct is not optimally assessed with this technique. If there is clinical suspicion of a distal obstructing process, such as choledocholithiasis, ERCP or MRCP examination may be more helpful for further evaluation. Mild periportal edema, nonspecific. Moderate stool seen throughout the colon without evidence of obstruction. Electronically Signed   By: Helyn Numbers M.D.   On: 08/27/2020 03:22   MR 3D Recon At Scanner  Result Date: 08/27/2020 CLINICAL DATA:  Jaundice in a 25 year old female reportedly with acute hepatitis. EXAM: MRI ABDOMEN WITHOUT  AND WITH CONTRAST (INCLUDING MRCP) TECHNIQUE: Multiplanar multisequence MR imaging of the abdomen was performed both before and after the administration of intravenous contrast. Heavily T2-weighted images of the biliary and pancreatic ducts were obtained, and three-dimensional MRCP images were rendered by post processing. CONTRAST:  6mL  GADAVIST GADOBUTROL 1 MMOL/ML IV SOLN COMPARISON:  CT evaluation August 27, 2020. FINDINGS: Lower chest: Incidental imaging of the lung bases is unremarkable by MRI on limited assessment. Hepatobiliary: MRCP images are limited by respiratory motion. There is a potential 3 mm filling defect in the dependent common bile duct. There is evidence of cholelithiasis. Diminished biliary duct and gallbladder distension since the previous evaluation. Potential sludge layering dependently best seen on image 17 of series 3 as low signal material in the dependent CBD but without focal characteristics. Potential filling defect, approximately 3 mm, seen on oblique reformatted images (image 191/102) common bile duct caliber now approximately 5 mm as compared to 8 mm. Diminished caliber of intrahepatic biliary ducts and decreased distension of the common bile duct. Diminished periportal edema. No focal, suspicious hepatic lesion. Pancreas: No sign of pancreatic edema. Normal intrinsic T1 signal in the pancreas. Pancreatic duct is nondilated. Spleen:  Normal spleen. Adrenals/Urinary Tract:  Adrenal glands are normal. Symmetric renal enhancement.  No hydronephrosis. Stomach/Bowel: Gastrointestinal tract is unremarkable to the extent evaluated on abdominal MRI. Vascular/Lymphatic: Vascular structures in the abdomen are patent. There is no gastrohepatic or hepatoduodenal ligament lymphadenopathy. No retroperitoneal or mesenteric lymphadenopathy. Other:  No ascites. Musculoskeletal: No suspicious bone lesions identified. IMPRESSION: 1. Diminished biliary duct dilation and distension of the gallbladder compared to the prior study. Correlate with any improvement in symptoms that might indicate recent passage of a larger biliary calculus. 2. Suspected small intraductal calculus in the common bile duct at 3 mm though with limited assessment due to motion artifact on MRCP sequences, perhaps with a small amount of adjacent sludge. 3.  Cholelithiasis. 4. Diminished periportal edema. 5. No sign of pancreatic edema. These results will be called to the ordering clinician or representative by the Radiologist Assistant, and communication documented in the PACS or [employer]. Electronically Signed   By: Donzetta Kohut M.D.   On: 08/27/2020 11:32   US Abdomen Limited  Result Date: 08/27/2020 CLINICAL DATA:  Right upper quadrant pain EXAM: ULTRASOUND ABDOMEN LIMITED RIGHT UPPER QUADRANT COMPARISON:  None. FINDINGS: Gallbladder: Very small amount of sludge in the gallbladder. A negative sonographic Eulah Pont sign was reported by the sonographer. No gallbladder wall thickening or pericholecystic fluid. Common bile duct: Diameter: 9 mm Liver: No focal lesion identified. Within normal limits in parenchymal echogenicity. Portal vein is patent on color Doppler imaging with normal direction of blood flow towards the liver. Other: None. IMPRESSION: Gallbladder sludge without other evidence of acute cholecystitis. Electronically Signed   By: Deatra Robinson M.D.   On: 08/27/2020 01:45   DG ERCP BILIARY & PANCREATIC DUCTS  Result Date: 08/28/2020 CLINICAL DATA:  Abdominal pain EXAM: ERCP ERCP with sphincterotomy TECHNIQUE: Multiple spot images obtained with the fluoroscopic device and submitted for interpretation post-procedure. FLUOROSCOPY TIME:  Fluoroscopy Time:  3 minutes 51 seconds Radiation Exposure Index (if provided by the fluoroscopic device): 32.5 mGy Number of Acquired Spot Images: 6 COMPARISON:  MRI abdomen 08/27/2020 FINDINGS: Submitted intraoperative fluoroscopic images demonstrate cannulation and opacification of the common bile duct. There is a small questionable filling defect in the mid common bile duct on image 4 which is not identified on the final image 6. IMPRESSION: Intraoperative fluoroscopic  images of ERCP as above. These images were submitted for radiologic interpretation only. Please see the procedural report for the amount of  contrast and the fluoroscopy time utilized. Electronically Signed   By: Acquanetta Belling M.D.   On: 08/28/2020 10:27   MR ABDOMEN MRCP W WO CONTAST  Result Date: 08/27/2020 CLINICAL DATA:  Jaundice in a 25 year old female reportedly with acute hepatitis. EXAM: MRI ABDOMEN WITHOUT AND WITH CONTRAST (INCLUDING MRCP) TECHNIQUE: Multiplanar multisequence MR imaging of the abdomen was performed both before and after the administration of intravenous contrast. Heavily T2-weighted images of the biliary and pancreatic ducts were obtained, and three-dimensional MRCP images were rendered by post processing. CONTRAST:  85mL GADAVIST GADOBUTROL 1 MMOL/ML IV SOLN COMPARISON:  CT evaluation August 27, 2020. FINDINGS: Lower chest: Incidental imaging of the lung bases is unremarkable by MRI on limited assessment. Hepatobiliary: MRCP images are limited by respiratory motion. There is a potential 3 mm filling defect in the dependent common bile duct. There is evidence of cholelithiasis. Diminished biliary duct and gallbladder distension since the previous evaluation. Potential sludge layering dependently best seen on image 17 of series 3 as low signal material in the dependent CBD but without focal characteristics. Potential filling defect, approximately 3 mm, seen on oblique reformatted images (image 191/102) common bile duct caliber now approximately 5 mm as compared to 8 mm. Diminished caliber of intrahepatic biliary ducts and decreased distension of the common bile duct. Diminished periportal edema. No focal, suspicious hepatic lesion. Pancreas: No sign of pancreatic edema. Normal intrinsic T1 signal in the pancreas. Pancreatic duct is nondilated. Spleen:  Normal spleen. Adrenals/Urinary Tract:  Adrenal glands are normal. Symmetric renal enhancement.  No hydronephrosis. Stomach/Bowel: Gastrointestinal tract is unremarkable to the extent evaluated on abdominal MRI. Vascular/Lymphatic: Vascular structures in the abdomen are patent.  There is no gastrohepatic or hepatoduodenal ligament lymphadenopathy. No retroperitoneal or mesenteric lymphadenopathy. Other:  No ascites. Musculoskeletal: No suspicious bone lesions identified. IMPRESSION: 1. Diminished biliary duct dilation and distension of the gallbladder compared to the prior study. Correlate with any improvement in symptoms that might indicate recent passage of a larger biliary calculus. 2. Suspected small intraductal calculus in the common bile duct at 3 mm though with limited assessment due to motion artifact on MRCP sequences, perhaps with a small amount of adjacent sludge. 3. Cholelithiasis. 4. Diminished periportal edema. 5. No sign of pancreatic edema. These results will be called to the ordering clinician or representative by the Radiologist Assistant, and communication documented in the PACS or [employer]. Electronically Signed   By: Donzetta Kohut M.D.   On: 08/27/2020 11:32      DVT prophylaxis: SCDs  Code Status: Full code  Family Communication: No family at bedside   Consultants: General surgery Gastroenterology  Procedures:     Objective    Physical Examination:   General: Appears in no acute distress Cardiovascular: S1-S2, regular, no murmur auscultated Respiratory: Clear to auscultation bilaterally Abdomen: Abdomen is soft, nontender, no organomegaly Extremities: No edema the lower extremities Neurologic: Alert, oriented x3, intact insight and judgment   Status is: Inpatient  Dispo: The patient is from: Home              Anticipated d/c is to: Home              Anticipated d/c date is: 08/31/2020              Patient currently not stable for discharge  Barrier to discharge-awaiting laparoscopic cholecystectomy  COVID-19 Labs  No results for input(s): DDIMER, FERRITIN, LDH, CRP in the last 72 hours.  Lab Results  Component Value Date   SARSCOV2NAA NEGATIVE 08/27/2020    Microbiology  Recent Results (from the past 240  hour(s))  Resp Panel by RT-PCR (Flu A&B, Covid) Nasopharyngeal Swab     Status: None   Collection Time: 08/27/20  4:20 AM   Specimen: Nasopharyngeal Swab; Nasopharyngeal(NP) swabs in vial transport medium  Result Value Ref Range Status   SARS Coronavirus 2 by RT PCR NEGATIVE NEGATIVE Final    Comment: (NOTE) SARS-CoV-2 target nucleic acids are NOT DETECTED.  The SARS-CoV-2 RNA is generally detectable in upper respiratory specimens during the acute phase of infection. The lowest concentration of SARS-CoV-2 viral copies this assay can detect is 138 copies/mL. A negative result does not preclude SARS-Cov-2 infection and should not be used as the sole basis for treatment or other patient management decisions. A negative result may occur with  improper specimen collection/handling, submission of specimen other than nasopharyngeal swab, presence of viral mutation(s) within the areas targeted by this assay, and inadequate number of viral copies(<138 copies/mL). A negative result must be combined with clinical observations, patient history, and epidemiological information. The expected result is Negative.  Fact Sheet for Patients:  BloggerCourse.comhttps://www.fda.gov/media/152166/download  Fact Sheet for Healthcare Providers:  SeriousBroker.ithttps://www.fda.gov/media/152162/download  This test is no t yet approved or cleared by the [place] FDA and  has been authorized for detection and/or diagnosis of SARS-CoV-2 by FDA under an Emergency Use Authorization (EUA). This EUA will remain  in effect (meaning this test can be used) for the duration of the COVID-19 declaration under Section 564(b)(1) of the Act, 21 U.S.C.section 360bbb-3(b)(1), unless the authorization is terminated  or revoked sooner.       Influenza A by PCR NEGATIVE NEGATIVE Final   Influenza B by PCR NEGATIVE NEGATIVE Final    Comment: (NOTE) The Xpert Xpress SARS-CoV-2/FLU/RSV plus assay is intended as an aid in the diagnosis of influenza from  Nasopharyngeal swab specimens and should not be used as a sole basis for treatment. Nasal washings and aspirates are unacceptable for Xpert Xpress SARS-CoV-2/FLU/RSV testing.  Fact Sheet for Patients: BloggerCourse.comhttps://www.fda.gov/media/152166/download  Fact Sheet for Healthcare Providers: SeriousBroker.ithttps://www.fda.gov/media/152162/download  This test is not yet approved or cleared by the [place] FDA and has been authorized for detection and/or diagnosis of SARS-CoV-2 by FDA under an Emergency Use Authorization (EUA). This EUA will remain in effect (meaning this test can be used) for the duration of the COVID-19 declaration under Section 564(b)(1) of the Act, 21 U.S.C. section 360bbb-3(b)(1), unless the authorization is terminated or revoked.  Performed at [facility], [address] [place], [place] [id number]              Meredeth IdeGagan S Marypat Kimmet   [facility] If 7PM-7AM, please contact night-coverage at [email or web address], Office  [phone]   08/28/2020, 1:21 PM  LOS: 0 days

## 2020-08-28 NOTE — Progress Notes (Signed)
Brief Pharmacy Note: Zosyn dose pre- ERCP  Zosyn 3.375gm over 30 minutes ordered x1  Protocol order d/c  Otho Bellows PharmD [facility] [phone] 08/28/2020, 8:09 AM

## 2020-08-28 NOTE — Anesthesia Procedure Notes (Signed)
Procedure Name: Intubation Date/Time: 08/28/2020 9:24 AM Performed by: Cleda Daub, CRNA Pre-anesthesia Checklist: Patient identified, Emergency Drugs available, Suction available and Patient being monitored Patient Re-evaluated:Patient Re-evaluated prior to induction Oxygen Delivery Method: Circle system utilized Preoxygenation: Pre-oxygenation with 100% oxygen Induction Type: IV induction Ventilation: Mask ventilation without difficulty Laryngoscope Size: Mac and 3 Grade View: Grade I Tube type: Oral Tube size: 7.0 mm Number of attempts: 1 Airway Equipment and Method: Stylet and Oral airway Placement Confirmation: ETT inserted through vocal cords under direct vision, positive ETCO2 and breath sounds checked- equal and bilateral Secured at: 20 cm Tube secured with: Tape Dental Injury: Teeth and Oropharynx as per pre-operative assessment

## 2020-08-29 ENCOUNTER — Encounter (HOSPITAL_COMMUNITY): Payer: Self-pay | Admitting: Internal Medicine

## 2020-08-29 DIAGNOSIS — R188 Other ascites: Secondary | ICD-10-CM | POA: Diagnosis present

## 2020-08-29 DIAGNOSIS — R1011 Right upper quadrant pain: Secondary | ICD-10-CM

## 2020-08-29 DIAGNOSIS — Z20822 Contact with and (suspected) exposure to covid-19: Secondary | ICD-10-CM | POA: Diagnosis present

## 2020-08-29 DIAGNOSIS — K828 Other specified diseases of gallbladder: Secondary | ICD-10-CM | POA: Diagnosis present

## 2020-08-29 DIAGNOSIS — K5900 Constipation, unspecified: Secondary | ICD-10-CM | POA: Diagnosis present

## 2020-08-29 DIAGNOSIS — K8500 Idiopathic acute pancreatitis without necrosis or infection: Secondary | ICD-10-CM | POA: Diagnosis not present

## 2020-08-29 DIAGNOSIS — K8070 Calculus of gallbladder and bile duct without cholecystitis without obstruction: Secondary | ICD-10-CM | POA: Diagnosis present

## 2020-08-29 DIAGNOSIS — R7989 Other specified abnormal findings of blood chemistry: Secondary | ICD-10-CM | POA: Diagnosis not present

## 2020-08-29 DIAGNOSIS — K8050 Calculus of bile duct without cholangitis or cholecystitis without obstruction: Secondary | ICD-10-CM | POA: Diagnosis not present

## 2020-08-29 DIAGNOSIS — E876 Hypokalemia: Secondary | ICD-10-CM | POA: Diagnosis present

## 2020-08-29 DIAGNOSIS — K859 Acute pancreatitis without necrosis or infection, unspecified: Secondary | ICD-10-CM

## 2020-08-29 DIAGNOSIS — R109 Unspecified abdominal pain: Secondary | ICD-10-CM | POA: Diagnosis present

## 2020-08-29 DIAGNOSIS — R1013 Epigastric pain: Secondary | ICD-10-CM | POA: Diagnosis not present

## 2020-08-29 DIAGNOSIS — K8020 Calculus of gallbladder without cholecystitis without obstruction: Secondary | ICD-10-CM | POA: Diagnosis not present

## 2020-08-29 DIAGNOSIS — K219 Gastro-esophageal reflux disease without esophagitis: Secondary | ICD-10-CM | POA: Diagnosis present

## 2020-08-29 LAB — COMPREHENSIVE METABOLIC PANEL
ALT: 227 U/L — ABNORMAL HIGH (ref 0–44)
AST: 99 U/L — ABNORMAL HIGH (ref 15–41)
Albumin: 4 g/dL (ref 3.5–5.0)
Alkaline Phosphatase: 120 U/L (ref 38–126)
Anion gap: 11 (ref 5–15)
BUN: 13 mg/dL (ref 6–20)
CO2: 25 mmol/L (ref 22–32)
Calcium: 9.4 mg/dL (ref 8.9–10.3)
Chloride: 101 mmol/L (ref 98–111)
Creatinine, Ser: 0.81 mg/dL (ref 0.44–1.00)
GFR, Estimated: >60 mL/min (ref >60)
Glucose, Bld: 108 mg/dL — ABNORMAL HIGH (ref 70–99)
Potassium: 4.1 mmol/L (ref 3.5–5.1)
Sodium: 137 mmol/L (ref 135–145)
Total Bilirubin: 3.7 mg/dL — ABNORMAL HIGH (ref 0.3–1.2)
Total Protein: 7.6 g/dL (ref 6.5–8.1)

## 2020-08-29 LAB — CBC
HCT: 38.1 % (ref 36.0–46.0)
Hemoglobin: 12.9 g/dL (ref 12.0–15.0)
MCH: 31.7 pg (ref 26.0–34.0)
MCHC: 33.9 g/dL (ref 30.0–36.0)
MCV: 93.6 fL (ref 80.0–100.0)
Platelets: 357 10*3/uL (ref 150–400)
RBC: 4.07 MIL/uL (ref 3.87–5.11)
RDW: 10.7 % — ABNORMAL LOW (ref 11.5–15.5)
WBC: 13.2 10*3/uL — ABNORMAL HIGH (ref 4.0–10.5)
nRBC: 0 % (ref 0.0–0.2)

## 2020-08-29 LAB — LIPASE, BLOOD: Lipase: 2985 U/L — ABNORMAL HIGH (ref 11–51)

## 2020-08-29 MED ORDER — SODIUM CHLORIDE 0.9 % IV SOLN
12.5000 mg | Freq: Once | INTRAVENOUS | Status: AC
  Administered 2020-08-29: 12.5 mg via INTRAVENOUS
  Filled 2020-08-29: qty 12.5

## 2020-08-29 MED ORDER — HYDROMORPHONE HCL 1 MG/ML IJ SOLN
1.0000 mg | INTRAMUSCULAR | Status: DC | PRN
Start: 2020-08-29 — End: 2020-08-31
  Administered 2020-08-29 – 2020-08-30 (×4): 1 mg via INTRAVENOUS
  Filled 2020-08-29 (×4): qty 1

## 2020-08-29 MED ORDER — LACTATED RINGERS IV SOLN: INTRAVENOUS | Status: DC

## 2020-08-29 MED ORDER — ONDANSETRON HCL 4 MG/2ML IJ SOLN: 4.0000 mg | Freq: Four times a day (QID) | INTRAMUSCULAR | Status: DC | PRN

## 2020-08-29 MED ORDER — HYDROMORPHONE HCL 1 MG/ML IJ SOLN: 1.0000 mg | INTRAMUSCULAR | Status: DC | PRN

## 2020-08-29 NOTE — Anesthesia Postprocedure Evaluation (Signed)
Anesthesia Post Note  Patient: Cynthia Avery  Procedure(s) Performed: ENDOSCOPIC RETROGRADE CHOLANGIOPANCREATOGRAPHY (ERCP) SPHINCTEROTOMY     Patient location during evaluation: PACU Anesthesia Type: General Level of consciousness: awake and alert and oriented Pain management: pain level controlled Vital Signs Assessment: post-procedure vital signs reviewed and stable Respiratory status: spontaneous breathing, nonlabored ventilation and respiratory function stable Cardiovascular status: blood pressure returned to baseline Postop Assessment: no apparent nausea or vomiting Anesthetic complications: no   No notable events documented.       Shanda Howells

## 2020-08-29 NOTE — Progress Notes (Signed)
1 Day Post-Op   Subjective/Chief Complaint: Nausea, vomiting, and epigastric pain with poor control on current regimen   Objective: Vital signs in last 24 hours: Temp:  [97.7 F (36.5 C)-98.8 F (37.1 C)] 98.4 F (36.9 C) (08/21 1820) Pulse Rate:  [51-82] 51 (08/21 1820) Resp:  [13-18] 16 (08/21 1820) BP: (104-124)/(64-81) 119/81 (08/21 1820) SpO2:  [98 %-100 %] 100 % (08/21 1820) Last BM Date: 08/26/20  Intake/Output from previous day: 08/21 0701 - 08/22 0700 In: 614.8 [P.O.:600; IV Piggyback:14.8] Out: 3 [Emesis/NG output:3] Intake/Output this shift: No intake/output data recorded.  She is alert, calm, no acute distress Unlabored respirations Abdomen is soft, minimally distended, tender to even light palpation in the central upper abdomen No lower extremity edema  Lab Results:  Recent Labs    08/26/20 2236 08/29/20 0402  WBC 8.9 13.2*  HGB 12.2 12.9  HCT 36.9 38.1  PLT 331 357   BMET Recent Labs    08/28/20 0354 08/29/20 0402  NA 138 137  K 4.0 4.1  CL 105 101  CO2 28 25  GLUCOSE 78 108*  BUN 8 13  CREATININE 0.75 0.81  CALCIUM 9.5 9.4   PT/INR No results for input(s): LABPROT, INR in the last 72 hours. ABG No results for input(s): PHART, HCO3 in the last 72 hours.  Invalid input(s): PCO2, PO2  Studies/Results: MR 3D Recon At Scanner  Result Date: 08/27/2020 CLINICAL DATA:  Jaundice in a 25 year old female reportedly with acute hepatitis. EXAM: MRI ABDOMEN WITHOUT AND WITH CONTRAST (INCLUDING MRCP) TECHNIQUE: Multiplanar multisequence MR imaging of the abdomen was performed both before and after the administration of intravenous contrast. Heavily T2-weighted images of the biliary and pancreatic ducts were obtained, and three-dimensional MRCP images were rendered by post processing. CONTRAST:  23mL GADAVIST GADOBUTROL 1 MMOL/ML IV SOLN COMPARISON:  CT evaluation August 27, 2020. FINDINGS: Lower chest: Incidental imaging of the lung bases is unremarkable  by MRI on limited assessment. Hepatobiliary: MRCP images are limited by respiratory motion. There is a potential 3 mm filling defect in the dependent common bile duct. There is evidence of cholelithiasis. Diminished biliary duct and gallbladder distension since the previous evaluation. Potential sludge layering dependently best seen on image 17 of series 3 as low signal material in the dependent CBD but without focal characteristics. Potential filling defect, approximately 3 mm, seen on oblique reformatted images (image 191/102) common bile duct caliber now approximately 5 mm as compared to 8 mm. Diminished caliber of intrahepatic biliary ducts and decreased distension of the common bile duct. Diminished periportal edema. No focal, suspicious hepatic lesion. Pancreas: No sign of pancreatic edema. Normal intrinsic T1 signal in the pancreas. Pancreatic duct is nondilated. Spleen:  Normal spleen. Adrenals/Urinary Tract:  Adrenal glands are normal. Symmetric renal enhancement.  No hydronephrosis. Stomach/Bowel: Gastrointestinal tract is unremarkable to the extent evaluated on abdominal MRI. Vascular/Lymphatic: Vascular structures in the abdomen are patent. There is no gastrohepatic or hepatoduodenal ligament lymphadenopathy. No retroperitoneal or mesenteric lymphadenopathy. Other:  No ascites. Musculoskeletal: No suspicious bone lesions identified. IMPRESSION: 1. Diminished biliary duct dilation and distension of the gallbladder compared to the prior study. Correlate with any improvement in symptoms that might indicate recent passage of a larger biliary calculus. 2. Suspected small intraductal calculus in the common bile duct at 3 mm though with limited assessment due to motion artifact on MRCP sequences, perhaps with a small amount of adjacent sludge. 3. Cholelithiasis. 4. Diminished periportal edema. 5. No sign of pancreatic edema. These results  will be called to the ordering clinician or representative by the  Radiologist Assistant, and communication documented in the PACS or [employer]. Electronically Signed   By: Donzetta Kohut M.D.   On: 08/27/2020 11:32   DG ERCP BILIARY & PANCREATIC DUCTS  Result Date: 08/28/2020 CLINICAL DATA:  Abdominal pain EXAM: ERCP ERCP with sphincterotomy TECHNIQUE: Multiple spot images obtained with the fluoroscopic device and submitted for interpretation post-procedure. FLUOROSCOPY TIME:  Fluoroscopy Time:  3 minutes 51 seconds Radiation Exposure Index (if provided by the fluoroscopic device): 32.5 mGy Number of Acquired Spot Images: 6 COMPARISON:  MRI abdomen 08/27/2020 FINDINGS: Submitted intraoperative fluoroscopic images demonstrate cannulation and opacification of the common bile duct. There is a small questionable filling defect in the mid common bile duct on image 4 which is not identified on the final image 6. IMPRESSION: Intraoperative fluoroscopic images of ERCP as above. These images were submitted for radiologic interpretation only. Please see the procedural report for the amount of contrast and the fluoroscopy time utilized. Electronically Signed   By: Acquanetta Belling M.D.   On: 08/28/2020 10:27   MR ABDOMEN MRCP W WO CONTAST  Result Date: 08/27/2020 CLINICAL DATA:  Jaundice in a 25 year old female reportedly with acute hepatitis. EXAM: MRI ABDOMEN WITHOUT AND WITH CONTRAST (INCLUDING MRCP) TECHNIQUE: Multiplanar multisequence MR imaging of the abdomen was performed both before and after the administration of intravenous contrast. Heavily T2-weighted images of the biliary and pancreatic ducts were obtained, and three-dimensional MRCP images were rendered by post processing. CONTRAST:  87mL GADAVIST GADOBUTROL 1 MMOL/ML IV SOLN COMPARISON:  CT evaluation August 27, 2020. FINDINGS: Lower chest: Incidental imaging of the lung bases is unremarkable by MRI on limited assessment. Hepatobiliary: MRCP images are limited by respiratory motion. There is a potential 3 mm filling  defect in the dependent common bile duct. There is evidence of cholelithiasis. Diminished biliary duct and gallbladder distension since the previous evaluation. Potential sludge layering dependently best seen on image 17 of series 3 as low signal material in the dependent CBD but without focal characteristics. Potential filling defect, approximately 3 mm, seen on oblique reformatted images (image 191/102) common bile duct caliber now approximately 5 mm as compared to 8 mm. Diminished caliber of intrahepatic biliary ducts and decreased distension of the common bile duct. Diminished periportal edema. No focal, suspicious hepatic lesion. Pancreas: No sign of pancreatic edema. Normal intrinsic T1 signal in the pancreas. Pancreatic duct is nondilated. Spleen:  Normal spleen. Adrenals/Urinary Tract:  Adrenal glands are normal. Symmetric renal enhancement.  No hydronephrosis. Stomach/Bowel: Gastrointestinal tract is unremarkable to the extent evaluated on abdominal MRI. Vascular/Lymphatic: Vascular structures in the abdomen are patent. There is no gastrohepatic or hepatoduodenal ligament lymphadenopathy. No retroperitoneal or mesenteric lymphadenopathy. Other:  No ascites. Musculoskeletal: No suspicious bone lesions identified. IMPRESSION: 1. Diminished biliary duct dilation and distension of the gallbladder compared to the prior study. Correlate with any improvement in symptoms that might indicate recent passage of a larger biliary calculus. 2. Suspected small intraductal calculus in the common bile duct at 3 mm though with limited assessment due to motion artifact on MRCP sequences, perhaps with a small amount of adjacent sludge. 3. Cholelithiasis. 4. Diminished periportal edema. 5. No sign of pancreatic edema. These results will be called to the ordering clinician or representative by the Radiologist Assistant, and communication documented in the PACS or [employer]. Electronically Signed   By: Donzetta Kohut M.D.    On: 08/27/2020 11:32    Anti-infectives: Anti-infectives (  From admission, onward)    Start     Dose/Rate Route Frequency Ordered Stop   08/28/20 0900  piperacillin-tazobactam (ZOSYN) IVPB 3.375 g        3.375 g 100 mL/hr over 30 Minutes Intravenous  Once 08/28/20 0375 08/28/20 4360       Assessment/Plan:  Otherwise healthy 25 year old woman with history of biliary colic, presented with choledocholithiasis 08/27/2020 Status post ERCP 08/28/2020, difficult cannulation, successful sphincterotomy and balloon sweep, stone seems to have passed Lipase elevated this morning 2985 and symptomatic with pain/nausea vomiting Will hold off on laparoscopic cholecystectomy for today, defer until pancreatitis is resolving.  Recheck labs tomorrow    LOS: 0 days    Berna Bue 08/29/2020

## 2020-08-29 NOTE — Progress Notes (Signed)
Patient having continued emesis. Paged L[facility] Flemings to ask for Reglan or Phenergan.

## 2020-08-29 NOTE — Progress Notes (Signed)
[facility]  PROGRESS NOTE  Cynthia Avery SAY:[id number] DOB: [birth year] DOA: 08/26/2020 PCP: Pcp, No   Brief HPI:   25 year old female with past medical history of GERD came to ED with complaints of epigastric and right upper quadrant pain.  Initial labs showed AST 219, ALT 76, total bilirubin was 3.8.  Right upper quadrant ultrasound showed gallbladder sludge without evidence of acute cholecystitis.  CT scan showed mild dilation of the biliary tree without obvious obstructing lesion.  [facility] was consulted.  MRCP showed diminished bilirubin dilatation and distention of the gallbladder compared to prior study and recommended general surgery consultation for laparoscopic cholecystectomy and IOC.  General surgery was consulted and they recommended getting ERCP before surgery.     Subjective   Complains of abdominal pain,  vomited x 4 times. S/p ERCP yesterday. Lipase is up to 2,985 this morning.   Assessment/Plan:     Right upper quadrant/epigastric pain -Suspecting symptomatic cholelithiasis/choledocholithiasis with passed stone -Initial labs showed AST 219, ALT 76, total bili 3.8 lipase 36>> repeat labs next day showed AST 616, ALT 453, total bilirubin 6.4 -Right upper quadrant ultrasound showed gallbladder sludge without evidence of acute cholecystitis -CT abdomen pelvis showed mild dilation of the biliary tree.  Distal obstructing lesion not clearly defined. -MRCP showed diminished biliary ductal dilatation and distention of the gallbladder compared to prior study, might indicate recent passage of large egregiously calculus.  Suspected small intraductal calculus in the CBD at 3 mm. -Both [facility] and general surgery was consulted -General surgery recommended ERCP before cholecystectomy -Patient underwent ERCP yesterday, which did not show any stone.  Likely she had passed stone.  As per Dr. Leone Payor difficult cannulation, successful sphincterotomy and balloon sweep.  Status post  ERCP pancreatitis - keep npo -start LR @ 200 ml/hr - continue Dilaudid prn for pain, zofran prn for vomiting -Follow lipase in a.m.   Hypokalemia -Replete  Scheduled medications:    polyethylene glycol  17 g Oral Daily   senna  2 tablet Oral Daily         Data Reviewed:   CBG:  No results for input(s): GLUCAP in the last 168 hours.  SpO2: 100 % O2 Flow Rate (L/min): 6 L/min    Vitals:   08/28/20 1114 08/28/20 1215 08/28/20 1331 08/28/20 1820  BP: 111/64 104/65 124/80 119/81  Pulse: (!) 59 66 77 (!) 51  Resp: 18 17 17 16   Temp: 98.8 F (37.1 C) 98.6 F (37 C) 98.4 F (36.9 C) 98.4 F (36.9 C)  TempSrc: Oral Oral Oral Oral  SpO2: 100% 99% 100% 100%  Weight:      Height:         Intake/Output Summary (Last 24 hours) at 08/29/2020 0836 Last data filed at 08/29/2020 0200 Gross per 24 hour  Intake 614.82 ml  Output 3 ml  Net 611.82 ml    08/20 1901 - 08/22 0700 In: 854.8 [P.O.:840] Out: 3   Filed Weights   08/26/20 2202  Weight: 54.4 kg    CBC:  Recent Labs  Lab 08/26/20 2236 08/29/20 0402  WBC 8.9 13.2*  HGB 12.2 12.9  HCT 36.9 38.1  PLT 331 357  MCV 95.6 93.6  MCH 31.6 31.7  MCHC 33.1 33.9  RDW 11.0* 10.7*  LYMPHSABS 0.7  --   MONOABS 0.6  --   EOSABS 0.0  --   BASOSABS 0.0  --     Complete metabolic panel:  Recent Labs  Lab 08/26/20 2236 08/27/20  1112 08/28/20 0354 08/29/20 0402  NA 137  --  138 137  K 3.4*  --  4.0 4.1  CL 101  --  105 101  CO2 28  --  28 25  GLUCOSE 92  --  78 108*  BUN 13  --  8 13  CREATININE 0.80  --  0.75 0.81  CALCIUM 9.2  --  9.5 9.4  AST 219* 616* 244* 99*  ALT 76* 453* 329* 227*  ALKPHOS 86 124 130* 120  BILITOT 3.8* 6.4* 5.6* 3.7*  ALBUMIN 4.4 4.1 3.8 4.0  MG  --  2.1  --   --     Recent Labs  Lab 08/26/20 2236 08/28/20 0354 08/29/20 0402  LIPASE 36 25 2,985*    Recent Labs  Lab 08/27/20 0420  SARSCOV2NAA NEGATIVE     ------------------------------------------------------------------------------------------------------------------ No results for input(s): CHOL, HDL, LDLCALC, TRIG, CHOLHDL, LDLDIRECT in the last 72 hours.  No results found for: HGBA1C ------------------------------------------------------------------------------------------------------------------ No results for input(s): TSH, T4TOTAL, T3FREE, THYROIDAB in the last 72 hours.  Invalid input(s): FREET3 ------------------------------------------------------------------------------------------------------------------ No results for input(s): VITAMINB12, FOLATE, FERRITIN, TIBC, IRON, RETICCTPCT in the last 72 hours.  Coagulation profile No results for input(s): INR, PROTIME in the last 168 hours. No results for input(s): DDIMER in the last 72 hours.  Cardiac Enzymes No results for input(s): CKTOTAL, CKMB, CKMBINDEX, TROPONINI in the last 168 hours.  ------------------------------------------------------------------------------------------------------------------ No results found for: BNP   Antibiotics: Anti-infectives (From admission, onward)    Start     Dose/Rate Route Frequency Ordered Stop   08/28/20 0900  piperacillin-tazobactam (ZOSYN) IVPB 3.375 g        3.375 g 100 mL/hr over 30 Minutes Intravenous  Once 08/28/20 7591 08/28/20 6384        Radiology Reports  MR 3D Recon At Scanner  Result Date: 08/27/2020 CLINICAL DATA:  Jaundice in a 25 year old female reportedly with acute hepatitis. EXAM: MRI ABDOMEN WITHOUT AND WITH CONTRAST (INCLUDING MRCP) TECHNIQUE: Multiplanar multisequence MR imaging of the abdomen was performed both before and after the administration of intravenous contrast. Heavily T2-weighted images of the biliary and pancreatic ducts were obtained, and three-dimensional MRCP images were rendered by post processing. CONTRAST:  28mL GADAVIST GADOBUTROL 1 MMOL/ML IV SOLN COMPARISON:  CT evaluation August 27, 2020. FINDINGS: Lower chest: Incidental imaging of the lung bases is unremarkable by MRI on limited assessment. Hepatobiliary: MRCP images are limited by respiratory motion. There is a potential 3 mm filling defect in the dependent common bile duct. There is evidence of cholelithiasis. Diminished biliary duct and gallbladder distension since the previous evaluation. Potential sludge layering dependently best seen on image 17 of series 3 as low signal material in the dependent CBD but without focal characteristics. Potential filling defect, approximately 3 mm, seen on oblique reformatted images (image 191/102) common bile duct caliber now approximately 5 mm as compared to 8 mm. Diminished caliber of intrahepatic biliary ducts and decreased distension of the common bile duct. Diminished periportal edema. No focal, suspicious hepatic lesion. Pancreas: No sign of pancreatic edema. Normal intrinsic T1 signal in the pancreas. Pancreatic duct is nondilated. Spleen:  Normal spleen. Adrenals/Urinary Tract:  Adrenal glands are normal. Symmetric renal enhancement.  No hydronephrosis. Stomach/Bowel: Gastrointestinal tract is unremarkable to the extent evaluated on abdominal MRI. Vascular/Lymphatic: Vascular structures in the abdomen are patent. There is no gastrohepatic or hepatoduodenal ligament lymphadenopathy. No retroperitoneal or mesenteric lymphadenopathy. Other:  No ascites. Musculoskeletal: No suspicious bone lesions identified. IMPRESSION: 1. Diminished  biliary duct dilation and distension of the gallbladder compared to the prior study. Correlate with any improvement in symptoms that might indicate recent passage of a larger biliary calculus. 2. Suspected small intraductal calculus in the common bile duct at 3 mm though with limited assessment due to motion artifact on MRCP sequences, perhaps with a small amount of adjacent sludge. 3. Cholelithiasis. 4. Diminished periportal edema. 5. No sign of pancreatic edema. These  results will be called to the ordering clinician or representative by the Radiologist Assistant, and communication documented in the PACS or [employer]. Electronically Signed   By: Donzetta Kohut M.D.   On: 08/27/2020 11:32   DG ERCP BILIARY & PANCREATIC DUCTS  Result Date: 08/28/2020 CLINICAL DATA:  Abdominal pain EXAM: ERCP ERCP with sphincterotomy TECHNIQUE: Multiple spot images obtained with the fluoroscopic device and submitted for interpretation post-procedure. FLUOROSCOPY TIME:  Fluoroscopy Time:  3 minutes 51 seconds Radiation Exposure Index (if provided by the fluoroscopic device): 32.5 mGy Number of Acquired Spot Images: 6 COMPARISON:  MRI abdomen 08/27/2020 FINDINGS: Submitted intraoperative fluoroscopic images demonstrate cannulation and opacification of the common bile duct. There is a small questionable filling defect in the mid common bile duct on image 4 which is not identified on the final image 6. IMPRESSION: Intraoperative fluoroscopic images of ERCP as above. These images were submitted for radiologic interpretation only. Please see the procedural report for the amount of contrast and the fluoroscopy time utilized. Electronically Signed   By: Acquanetta Belling M.D.   On: 08/28/2020 10:27   MR ABDOMEN MRCP W WO CONTAST  Result Date: 08/27/2020 CLINICAL DATA:  Jaundice in a 25 year old female reportedly with acute hepatitis. EXAM: MRI ABDOMEN WITHOUT AND WITH CONTRAST (INCLUDING MRCP) TECHNIQUE: Multiplanar multisequence MR imaging of the abdomen was performed both before and after the administration of intravenous contrast. Heavily T2-weighted images of the biliary and pancreatic ducts were obtained, and three-dimensional MRCP images were rendered by post processing. CONTRAST:  6mL GADAVIST GADOBUTROL 1 MMOL/ML IV SOLN COMPARISON:  CT evaluation August 27, 2020. FINDINGS: Lower chest: Incidental imaging of the lung bases is unremarkable by MRI on limited assessment. Hepatobiliary: MRCP  images are limited by respiratory motion. There is a potential 3 mm filling defect in the dependent common bile duct. There is evidence of cholelithiasis. Diminished biliary duct and gallbladder distension since the previous evaluation. Potential sludge layering dependently best seen on image 17 of series 3 as low signal material in the dependent CBD but without focal characteristics. Potential filling defect, approximately 3 mm, seen on oblique reformatted images (image 191/102) common bile duct caliber now approximately 5 mm as compared to 8 mm. Diminished caliber of intrahepatic biliary ducts and decreased distension of the common bile duct. Diminished periportal edema. No focal, suspicious hepatic lesion. Pancreas: No sign of pancreatic edema. Normal intrinsic T1 signal in the pancreas. Pancreatic duct is nondilated. Spleen:  Normal spleen. Adrenals/Urinary Tract:  Adrenal glands are normal. Symmetric renal enhancement.  No hydronephrosis. Stomach/Bowel: Gastrointestinal tract is unremarkable to the extent evaluated on abdominal MRI. Vascular/Lymphatic: Vascular structures in the abdomen are patent. There is no gastrohepatic or hepatoduodenal ligament lymphadenopathy. No retroperitoneal or mesenteric lymphadenopathy. Other:  No ascites. Musculoskeletal: No suspicious bone lesions identified. IMPRESSION: 1. Diminished biliary duct dilation and distension of the gallbladder compared to the prior study. Correlate with any improvement in symptoms that might indicate recent passage of a larger biliary calculus. 2. Suspected small intraductal calculus in the common bile duct at 3 mm  though with limited assessment due to motion artifact on MRCP sequences, perhaps with a small amount of adjacent sludge. 3. Cholelithiasis. 4. Diminished periportal edema. 5. No sign of pancreatic edema. These results will be called to the ordering clinician or representative by the Radiologist Assistant, and communication documented in  the PACS or [employer]. Electronically Signed   By: Donzetta KohutGeoffrey  Wile M.D.   On: 08/27/2020 11:32      DVT prophylaxis: SCDs  Code Status: Full code  Family Communication: No family at bedside   Consultants: General surgery Gastroenterology  Procedures:     Objective    Physical Examination:  General-appears in no acute distress Heart-S1-S2, regular, no murmur auscultated Lungs-clear to auscultation bilaterally, no wheezing or crackles auscultated Abdomen-soft, tender to palpation epigastric region, no organomegaly Extremities-no edema in the lower extremities Neuro-alert, oriented x3, no focal deficit noted  Status is: Inpatient  Dispo: The patient is from: Home              Anticipated d/c is to: Home              Anticipated d/c date is: 08/31/2020              Patient currently not stable for discharge  Barrier to discharge-awaiting laparoscopic cholecystectomy  COVID-19 Labs  No results for input(s): DDIMER, FERRITIN, LDH, CRP in the last 72 hours.  Lab Results  Component Value Date   SARSCOV2NAA NEGATIVE 08/27/2020    Microbiology  Recent Results (from the past 240 hour(s))  Resp Panel by RT-PCR (Flu A&B, Covid) Nasopharyngeal Swab     Status: None   Collection Time: 08/27/20  4:20 AM   Specimen: Nasopharyngeal Swab; Nasopharyngeal(NP) swabs in vial transport medium  Result Value Ref Range Status   SARS Coronavirus 2 by RT PCR NEGATIVE NEGATIVE Final    Comment: (NOTE) SARS-CoV-2 target nucleic acids are NOT DETECTED.  The SARS-CoV-2 RNA is generally detectable in upper respiratory specimens during the acute phase of infection. The lowest concentration of SARS-CoV-2 viral copies this assay can detect is 138 copies/mL. A negative result does not preclude SARS-Cov-2 infection and should not be used as the sole basis for treatment or other patient management decisions. A negative result may occur with  improper specimen collection/handling,  submission of specimen other than nasopharyngeal swab, presence of viral mutation(s) within the areas targeted by this assay, and inadequate number of viral copies(<138 copies/mL). A negative result must be combined with clinical observations, patient history, and epidemiological information. The expected result is Negative.  Fact Sheet for Patients:  BloggerCourse.comhttps://www.fda.gov/media/152166/download  Fact Sheet for Healthcare Providers:  SeriousBroker.ithttps://www.fda.gov/media/152162/download  This test is no t yet approved or cleared by the [place] FDA and  has been authorized for detection and/or diagnosis of SARS-CoV-2 by FDA under an Emergency Use Authorization (EUA). This EUA will remain  in effect (meaning this test can be used) for the duration of the COVID-19 declaration under Section 564(b)(1) of the Act, 21 U.S.C.section 360bbb-3(b)(1), unless the authorization is terminated  or revoked sooner.       Influenza A by PCR NEGATIVE NEGATIVE Final   Influenza B by PCR NEGATIVE NEGATIVE Final    Comment: (NOTE) The Xpert Xpress SARS-CoV-2/FLU/RSV plus assay is intended as an aid in the diagnosis of influenza from Nasopharyngeal swab specimens and should not be used as a sole basis for treatment. Nasal washings and aspirates are unacceptable for Xpert Xpress SARS-CoV-2/FLU/RSV testing.  Fact Sheet for Patients: BloggerCourse.comhttps://www.fda.gov/media/152166/download  Fact  Sheet for Healthcare Providers: SeriousBroker.it  This test is not yet approved or cleared by the [place] and has been authorized for detection and/or diagnosis of SARS-CoV-2 by FDA under an Emergency Use Authorization (EUA). This EUA will remain in effect (meaning this test can be used) for the duration of the COVID-19 declaration under Section 564(b)(1) of the Act, 21 U.S.C. section 360bbb-3(b)(1), unless the authorization is terminated or revoked.  Performed at [facility], [address] [place], [place] [address]         Meredeth Ide   [facility] If 7PM-7AM, please contact night-coverage at [email or web address], Office  [phone]   08/29/2020, 8:36 AM  LOS: 0 days

## 2020-08-30 ENCOUNTER — Inpatient Hospital Stay (HOSPITAL_COMMUNITY): Payer: 59 | Admitting: Anesthesiology

## 2020-08-30 ENCOUNTER — Encounter (HOSPITAL_COMMUNITY): Payer: Self-pay | Admitting: Internal Medicine

## 2020-08-30 ENCOUNTER — Encounter (HOSPITAL_COMMUNITY)
Admission: EM | Disposition: A | Discharge status: Home / Self Care | Payer: Self-pay | Source: Home / Self Care | Attending: Family Medicine

## 2020-08-30 DIAGNOSIS — E876 Hypokalemia: Secondary | ICD-10-CM | POA: Diagnosis not present

## 2020-08-30 DIAGNOSIS — K8500 Idiopathic acute pancreatitis without necrosis or infection: Secondary | ICD-10-CM | POA: Diagnosis not present

## 2020-08-30 DIAGNOSIS — K8050 Calculus of bile duct without cholangitis or cholecystitis without obstruction: Secondary | ICD-10-CM | POA: Diagnosis not present

## 2020-08-30 HISTORY — PX: CHOLECYSTECTOMY: SHX55

## 2020-08-30 LAB — COMPREHENSIVE METABOLIC PANEL
ALT: 121 U/L — ABNORMAL HIGH (ref 0–44)
AST: 38 U/L (ref 15–41)
Albumin: 3.3 g/dL — ABNORMAL LOW (ref 3.5–5.0)
Alkaline Phosphatase: 96 U/L (ref 38–126)
Anion gap: 7 (ref 5–15)
BUN: 12 mg/dL (ref 6–20)
CO2: 27 mmol/L (ref 22–32)
Calcium: 9 mg/dL (ref 8.9–10.3)
Chloride: 101 mmol/L (ref 98–111)
Creatinine, Ser: 0.74 mg/dL (ref 0.44–1.00)
GFR, Estimated: >60 mL/min (ref >60)
Glucose, Bld: 80 mg/dL (ref 70–99)
Potassium: 3.6 mmol/L (ref 3.5–5.1)
Sodium: 135 mmol/L (ref 135–145)
Total Bilirubin: 3.5 mg/dL — ABNORMAL HIGH (ref 0.3–1.2)
Total Protein: 6.4 g/dL — ABNORMAL LOW (ref 6.5–8.1)

## 2020-08-30 LAB — CBC
HCT: 33.7 % — ABNORMAL LOW (ref 36.0–46.0)
Hemoglobin: 11.1 g/dL — ABNORMAL LOW (ref 12.0–15.0)
MCH: 31.7 pg (ref 26.0–34.0)
MCHC: 32.9 g/dL (ref 30.0–36.0)
MCV: 96.3 fL (ref 80.0–100.0)
Platelets: 271 10*3/uL (ref 150–400)
RBC: 3.5 MIL/uL — ABNORMAL LOW (ref 3.87–5.11)
RDW: 10.8 % — ABNORMAL LOW (ref 11.5–15.5)
WBC: 13 10*3/uL — ABNORMAL HIGH (ref 4.0–10.5)
nRBC: 0 % (ref 0.0–0.2)

## 2020-08-30 LAB — LIPASE, BLOOD: Lipase: 1319 U/L — ABNORMAL HIGH (ref 11–51)

## 2020-08-30 LAB — MAGNESIUM: Magnesium: 1.7 mg/dL (ref 1.7–2.4)

## 2020-08-30 SURGERY — LAPAROSCOPIC CHOLECYSTECTOMY
Anesthesia: General | Site: Abdomen

## 2020-08-30 MED ORDER — FENTANYL CITRATE (PF) 250 MCG/5ML IJ SOLN
INTRAMUSCULAR | Status: DC | PRN
  Administered 2020-08-30: 100 ug via INTRAVENOUS
  Administered 2020-08-30 (×2): 50 ug via INTRAVENOUS

## 2020-08-30 MED ORDER — OXYCODONE HCL 5 MG PO TABS: 5.0000 mg | ORAL_TABLET | Freq: Once | ORAL | Status: DC | PRN

## 2020-08-30 MED ORDER — LACTATED RINGERS IV SOLN: INTRAVENOUS | Status: DC

## 2020-08-30 MED ORDER — PROMETHAZINE HCL 25 MG/ML IJ SOLN: 6.2500 mg | INTRAMUSCULAR | Status: DC | PRN

## 2020-08-30 MED ORDER — ROCURONIUM BROMIDE 10 MG/ML (PF) SYRINGE
PREFILLED_SYRINGE | INTRAVENOUS | Status: AC
  Filled 2020-08-30: qty 10

## 2020-08-30 MED ORDER — FENTANYL CITRATE (PF) 100 MCG/2ML IJ SOLN
INTRAMUSCULAR | Status: AC
  Filled 2020-08-30: qty 2

## 2020-08-30 MED ORDER — DEXAMETHASONE SODIUM PHOSPHATE 10 MG/ML IJ SOLN
INTRAMUSCULAR | Status: AC
  Filled 2020-08-30: qty 1

## 2020-08-30 MED ORDER — PROPOFOL 10 MG/ML IV BOLUS
INTRAVENOUS | Status: DC | PRN
  Administered 2020-08-30: 180 mg via INTRAVENOUS

## 2020-08-30 MED ORDER — ENOXAPARIN SODIUM 40 MG/0.4ML IJ SOSY
40.0000 mg | PREFILLED_SYRINGE | INTRAMUSCULAR | Status: DC
  Administered 2020-08-31: 40 mg via SUBCUTANEOUS
  Filled 2020-08-30: qty 0.4

## 2020-08-30 MED ORDER — METHOCARBAMOL 1000 MG/10ML IJ SOLN
500.0000 mg | Freq: Four times a day (QID) | INTRAVENOUS | Status: DC | PRN
  Filled 2020-08-30: qty 5

## 2020-08-30 MED ORDER — FENTANYL CITRATE (PF) 100 MCG/2ML IJ SOLN: 25.0000 ug | INTRAMUSCULAR | Status: DC | PRN

## 2020-08-30 MED ORDER — LIDOCAINE 2% (20 MG/ML) 5 ML SYRINGE
INTRAMUSCULAR | Status: AC
  Filled 2020-08-30: qty 5

## 2020-08-30 MED ORDER — LACTATED RINGERS IR SOLN
Status: DC | PRN
  Administered 2020-08-30: 1000 mL

## 2020-08-30 MED ORDER — ONDANSETRON HCL 4 MG/2ML IJ SOLN
INTRAMUSCULAR | Status: DC | PRN
  Administered 2020-08-30: 4 mg via INTRAVENOUS

## 2020-08-30 MED ORDER — MIDAZOLAM HCL 2 MG/2ML IJ SOLN
INTRAMUSCULAR | Status: AC
  Filled 2020-08-30: qty 2

## 2020-08-30 MED ORDER — OXYCODONE HCL 5 MG/5ML PO SOLN: 5.0000 mg | Freq: Once | ORAL | Status: DC | PRN

## 2020-08-30 MED ORDER — PROPOFOL 10 MG/ML IV BOLUS
INTRAVENOUS | Status: AC
  Filled 2020-08-30: qty 20

## 2020-08-30 MED ORDER — TRAMADOL HCL 50 MG PO TABS
50.0000 mg | ORAL_TABLET | Freq: Four times a day (QID) | ORAL | Status: DC | PRN
  Administered 2020-08-30: 50 mg via ORAL
  Filled 2020-08-30: qty 1

## 2020-08-30 MED ORDER — DEXAMETHASONE SODIUM PHOSPHATE 10 MG/ML IJ SOLN
INTRAMUSCULAR | Status: DC | PRN
  Administered 2020-08-30: 10 mg via INTRAVENOUS

## 2020-08-30 MED ORDER — CEFAZOLIN SODIUM-DEXTROSE 2-4 GM/100ML-% IV SOLN
2.0000 g | INTRAVENOUS | Status: AC
  Administered 2020-08-30: 2 g via INTRAVENOUS
  Filled 2020-08-30: qty 100

## 2020-08-30 MED ORDER — ROCURONIUM BROMIDE 10 MG/ML (PF) SYRINGE
PREFILLED_SYRINGE | INTRAVENOUS | Status: DC | PRN
  Administered 2020-08-30: 50 mg via INTRAVENOUS

## 2020-08-30 MED ORDER — LIDOCAINE 2% (20 MG/ML) 5 ML SYRINGE
INTRAMUSCULAR | Status: DC | PRN
  Administered 2020-08-30: 80 mg via INTRAVENOUS

## 2020-08-30 MED ORDER — PHENOL 1.4 % MT LIQD: 1.0000 | OROMUCOSAL | Status: DC | PRN

## 2020-08-30 MED ORDER — SUGAMMADEX SODIUM 200 MG/2ML IV SOLN
INTRAVENOUS | Status: DC | PRN
  Administered 2020-08-30: 125 mg via INTRAVENOUS

## 2020-08-30 MED ORDER — ACETAMINOPHEN 10 MG/ML IV SOLN
1000.0000 mg | Freq: Once | INTRAVENOUS | Status: DC | PRN
  Administered 2020-08-30: 1000 mg via INTRAVENOUS

## 2020-08-30 MED ORDER — ACETAMINOPHEN 500 MG PO TABS: 1000.0000 mg | ORAL_TABLET | Freq: Once | ORAL | Status: DC | PRN

## 2020-08-30 MED ORDER — ONDANSETRON HCL 4 MG/2ML IJ SOLN
INTRAMUSCULAR | Status: AC
  Filled 2020-08-30: qty 2

## 2020-08-30 MED ORDER — BUPIVACAINE-EPINEPHRINE (PF) 0.25% -1:200000 IJ SOLN
INTRAMUSCULAR | Status: AC
  Filled 2020-08-30: qty 30

## 2020-08-30 MED ORDER — ACETAMINOPHEN 160 MG/5ML PO SOLN: 1000.0000 mg | Freq: Once | ORAL | Status: DC | PRN

## 2020-08-30 MED ORDER — BUPIVACAINE-EPINEPHRINE 0.25% -1:200000 IJ SOLN
INTRAMUSCULAR | Status: DC | PRN
  Administered 2020-08-30: 30 mL

## 2020-08-30 MED ORDER — CHLORHEXIDINE GLUCONATE 0.12 % MT SOLN
15.0000 mL | OROMUCOSAL | Status: AC
  Administered 2020-08-30: 15 mL via OROMUCOSAL

## 2020-08-30 MED ORDER — ACETAMINOPHEN 10 MG/ML IV SOLN
INTRAVENOUS | Status: AC
  Filled 2020-08-30: qty 100

## 2020-08-30 SURGICAL SUPPLY — 35 items
APPLIER CLIP ROT 10 11.4 M/L (STAPLE) ×2
BAG COUNTER SPONGE SURGICOUNT (BAG) IMPLANT
CABLE HIGH FREQUENCY MONO STRZ (ELECTRODE) ×2 IMPLANT
CHLORAPREP W/TINT 26 (MISCELLANEOUS) ×2 IMPLANT
CLIP APPLIE ROT 10 11.4 M/L (STAPLE) ×1 IMPLANT
COVER MAYO STAND STRL (DRAPES) IMPLANT
COVER SURGICAL LIGHT HANDLE (MISCELLANEOUS) ×2 IMPLANT
DECANTER SPIKE VIAL GLASS SM (MISCELLANEOUS) ×2 IMPLANT
DERMABOND ADVANCED (GAUZE/BANDAGES/DRESSINGS) ×1
DERMABOND ADVANCED .7 DNX12 (GAUZE/BANDAGES/DRESSINGS) ×1 IMPLANT
DRAPE C-ARM 42X120 X-RAY (DRAPES) IMPLANT
ELECT REM PT RETURN 15FT ADLT (MISCELLANEOUS) ×2 IMPLANT
GLOVE SURG ENC MOIS LTX SZ6 (GLOVE) ×2 IMPLANT
GLOVE SURG MICRO LTX SZ6 (GLOVE) ×2 IMPLANT
GLOVE SURG UNDER LTX SZ6.5 (GLOVE) ×2 IMPLANT
GOWN STRL REUS W/TWL LRG LVL3 (GOWN DISPOSABLE) ×2 IMPLANT
GOWN STRL REUS W/TWL XL LVL3 (GOWN DISPOSABLE) ×4 IMPLANT
GRASPER SUT TROCAR 14GX15 (MISCELLANEOUS) ×2 IMPLANT
HEMOSTAT SNOW SURGICEL 2X4 (HEMOSTASIS) IMPLANT
KIT BASIN OR (CUSTOM PROCEDURE TRAY) ×2 IMPLANT
KIT TURNOVER KIT A (KITS) ×2 IMPLANT
NEEDLE INSUFFLATION 14GA 120MM (NEEDLE) ×2 IMPLANT
PENCIL SMOKE EVACUATOR (MISCELLANEOUS) IMPLANT
POUCH SPECIMEN RETRIEVAL 10MM (ENDOMECHANICALS) ×2 IMPLANT
SCISSORS LAP 5X35 DISP (ENDOMECHANICALS) ×2 IMPLANT
SET CHOLANGIOGRAPH MIX (MISCELLANEOUS) IMPLANT
SET IRRIG TUBING LAPAROSCOPIC (IRRIGATION / IRRIGATOR) ×2 IMPLANT
SET TUBE SMOKE EVAC HIGH FLOW (TUBING) ×2 IMPLANT
SLEEVE XCEL OPT CAN 5 100 (ENDOMECHANICALS) ×4 IMPLANT
SUT MNCRL AB 4-0 PS2 18 (SUTURE) ×2 IMPLANT
TOWEL OR 17X26 10 PK STRL BLUE (TOWEL DISPOSABLE) ×2 IMPLANT
TOWEL OR NON WOVEN STRL DISP B (DISPOSABLE) IMPLANT
TRAY LAPAROSCOPIC (CUSTOM PROCEDURE TRAY) ×2 IMPLANT
TROCAR BLADELESS OPT 5 100 (ENDOMECHANICALS) ×2 IMPLANT
TROCAR XCEL 12X100 BLDLESS (ENDOMECHANICALS) ×2 IMPLANT

## 2020-08-30 NOTE — Progress Notes (Addendum)
[facility]  PROGRESS NOTE  Witney Huie IPJ:[id number] DOB: 13-Oct-1995 DOA: 08/26/2020 PCP: Pcp, No   Brief HPI:   25 year old female with past medical history of GERD came to ED with complaints of epigastric and right upper quadrant pain.  Initial labs showed AST 219, ALT 76, total bilirubin was 3.8.  Right upper quadrant ultrasound showed gallbladder sludge without evidence of acute cholecystitis.  CT scan showed mild dilation of the biliary tree without obvious obstructing lesion.  [facility] was consulted.  MRCP showed diminished bilirubin dilatation and distention of the gallbladder compared to prior study and recommended general surgery consultation for laparoscopic cholecystectomy and IOC.  General surgery was consulted and they recommended getting ERCP before surgery.     Subjective   Still has epigastric abdominal pain, improved. No vomiting, lipase is down to 13,00 this morning.   Assessment/Plan:    Right upper quadrant/epigastric pain -Suspecting symptomatic cholelithiasis/choledocholithiasis with passed stone -Initial labs showed AST 219, ALT 76, total bili 3.8 lipase 36>> repeat labs next day showed AST 616, ALT 453, total bilirubin 6.4 -Right upper quadrant ultrasound showed gallbladder sludge without evidence of acute cholecystitis -CT abdomen pelvis showed mild dilation of the biliary tree.  Distal obstructing lesion not clearly defined. -MRCP showed diminished biliary ductal dilatation and distention of the gallbladder compared to prior study, might indicate recent passage of large egregiously calculus.  Suspected small intraductal calculus in the CBD at 3 mm. -Both [facility] and general surgery was consulted -General surgery recommended ERCP before cholecystectomy -Patient underwent ERCP yesterday, which did not show any stone.  Likely she had passed stone.  As per Dr. Leone Payor difficult cannulation, successful sphincterotomy and balloon sweep. -Plan for cholecystectomy  today  Status post ERCP pancreatitis -Improved, lipase down to 1300  -Continue LR at 200 mL/h - continue Dilaudid prn for pain, zofran prn for vomiting -Follow lipase in a.m.   Hypokalemia -Replete  Scheduled medications:    polyethylene glycol  17 g Oral Daily   senna  2 tablet Oral Daily      Data Reviewed:   CBG:  No results for input(s): GLUCAP in the last 168 hours.  SpO2: 98 % O2 Flow Rate (L/min): 6 L/min    Vitals:   08/28/20 1331 08/28/20 1820 08/29/20 2158 08/30/20 0600  BP: 124/80 119/81 108/62 (!) 100/57  Pulse: 77 (!) 51 71 79  Resp: 17 16 16 16   Temp: 98.4 F (36.9 C) 98.4 F (36.9 C) 98.6 F (37 C) 99.2 F (37.3 C)  TempSrc: Oral Oral Oral Oral  SpO2: 100% 100% 98% 98%  Weight:      Height:         Intake/Output Summary (Last 24 hours) at 08/30/2020 0945 Last data filed at 08/30/2020 0800 Gross per 24 hour  Intake 3707.99 ml  Output --  Net 3707.99 ml    08/21 1901 - 08/23 0700 In: 3708 [I.V.:3708] Out: 3   Filed Weights   08/26/20 2202  Weight: 54.4 kg    CBC:  Recent Labs  Lab 08/26/20 2236 08/29/20 0402 08/30/20 0422  WBC 8.9 13.2* 13.0*  HGB 12.2 12.9 11.1*  HCT 36.9 38.1 33.7*  PLT 331 357 271  MCV 95.6 93.6 96.3  MCH 31.6 31.7 31.7  MCHC 33.1 33.9 32.9  RDW 11.0* 10.7* 10.8*  LYMPHSABS 0.7  --   --   MONOABS 0.6  --   --   EOSABS 0.0  --   --   BASOSABS 0.0  --   --  Complete metabolic panel:  Recent Labs  Lab 08/26/20 2236 08/27/20 1112 08/28/20 0354 08/29/20 0402 08/30/20 0422  NA 137  --  138 137 135  K 3.4*  --  4.0 4.1 3.6  CL 101  --  105 101 101  CO2 28  --  28 25 27   GLUCOSE 92  --  78 108* 80  BUN 13  --  8 13 12   CREATININE 0.80  --  0.75 0.81 0.74  CALCIUM 9.2  --  9.5 9.4 9.0  AST 219* 616* 244* 99* 38  ALT 76* 453* 329* 227* 121*  ALKPHOS 86 124 130* 120 96  BILITOT 3.8* 6.4* 5.6* 3.7* 3.5*  ALBUMIN 4.4 4.1 3.8 4.0 3.3*  MG  --  2.1  --   --  1.7    Recent Labs  Lab  08/26/20 2236 08/28/20 0354 08/29/20 0402  LIPASE 36 25 2,985*    Recent Labs  Lab 08/27/20 0420  SARSCOV2NAA NEGATIVE    ------------------------------------------------------------------------------------------------------------------ No results for input(s): CHOL, HDL, LDLCALC, TRIG, CHOLHDL, LDLDIRECT in the last 72 hours.  No results found for: HGBA1C ------------------------------------------------------------------------------------------------------------------ No results for input(s): TSH, T4TOTAL, T3FREE, THYROIDAB in the last 72 hours.  Invalid input(s): FREET3 ------------------------------------------------------------------------------------------------------------------ No results for input(s): VITAMINB12, FOLATE, FERRITIN, TIBC, IRON, RETICCTPCT in the last 72 hours.  Coagulation profile No results for input(s): INR, PROTIME in the last 168 hours. No results for input(s): DDIMER in the last 72 hours.  Cardiac Enzymes No results for input(s): CKTOTAL, CKMB, CKMBINDEX, TROPONINI in the last 168 hours.  ------------------------------------------------------------------------------------------------------------------ No results found for: BNP   Antibiotics: Anti-infectives (From admission, onward)    Start     Dose/Rate Route Frequency Ordered Stop   08/28/20 0900  piperacillin-tazobactam (ZOSYN) IVPB 3.375 g        3.375 g 100 mL/hr over 30 Minutes Intravenous  Once 08/28/20 08/30/20 08/28/20 0926        Radiology Reports  DG ERCP BILIARY & PANCREATIC DUCTS  Result Date: 08/28/2020 CLINICAL DATA:  Abdominal pain EXAM: ERCP ERCP with sphincterotomy TECHNIQUE: Multiple spot images obtained with the fluoroscopic device and submitted for interpretation post-procedure. FLUOROSCOPY TIME:  Fluoroscopy Time:  3 minutes 51 seconds Radiation Exposure Index (if provided by the fluoroscopic device): 32.5 mGy Number of Acquired Spot Images: 6 COMPARISON:  MRI abdomen  08/27/2020 FINDINGS: Submitted intraoperative fluoroscopic images demonstrate cannulation and opacification of the common bile duct. There is a small questionable filling defect in the mid common bile duct on image 4 which is not identified on the final image 6. IMPRESSION: Intraoperative fluoroscopic images of ERCP as above. These images were submitted for radiologic interpretation only. Please see the procedural report for the amount of contrast and the fluoroscopy time utilized. Electronically Signed   By: 08/30/2020 M.D.   On: 08/28/2020 10:27      DVT prophylaxis: SCDs  Code Status: Full code  Family Communication: No family at bedside   Consultants: General surgery Gastroenterology  Procedures:     Objective    Physical Examination:  General-appears in no acute distress Heart-S1-S2, regular, no murmur auscultated Lungs-clear to auscultation bilaterally, no wheezing or crackles auscultated Abdomen-soft, mild epigastric tenderness to palpation, no organomegaly Extremities-no edema in the lower extremities Neuro-alert, oriented x3, no focal deficit noted  Status is: Inpatient  Dispo: The patient is from: Home              Anticipated d/c is to: Home  Anticipated d/c date is: 08/31/2020              Patient currently not stable for discharge  Barrier to discharge-awaiting laparoscopic cholecystectomy  COVID-19 Labs  No results for input(s): DDIMER, FERRITIN, LDH, CRP in the last 72 hours.  Lab Results  Component Value Date   SARSCOV2NAA NEGATIVE 08/27/2020    Microbiology  Recent Results (from the past 240 hour(s))  Resp Panel by RT-PCR (Flu A&B, Covid) Nasopharyngeal Swab     Status: None   Collection Time: 08/27/20  4:20 AM   Specimen: Nasopharyngeal Swab; Nasopharyngeal(NP) swabs in vial transport medium  Result Value Ref Range Status   SARS Coronavirus 2 by RT PCR NEGATIVE NEGATIVE Final    Comment: (NOTE) SARS-CoV-2 target nucleic acids  are NOT DETECTED.  The SARS-CoV-2 RNA is generally detectable in upper respiratory specimens during the acute phase of infection. The lowest concentration of SARS-CoV-2 viral copies this assay can detect is 138 copies/mL. A negative result does not preclude SARS-Cov-2 infection and should not be used as the sole basis for treatment or other patient management decisions. A negative result may occur with  improper specimen collection/handling, submission of specimen other than nasopharyngeal swab, presence of viral mutation(s) within the areas targeted by this assay, and inadequate number of viral copies(<138 copies/mL). A negative result must be combined with clinical observations, patient history, and epidemiological information. The expected result is Negative.  Fact Sheet for Patients:  BloggerCourse.com  Fact Sheet for Healthcare Providers:  SeriousBroker.it  This test is no t yet approved or cleared by the [place] FDA and  has been authorized for detection and/or diagnosis of SARS-CoV-2 by FDA under an Emergency Use Authorization (EUA). This EUA will remain  in effect (meaning this test can be used) for the duration of the COVID-19 declaration under Section 564(b)(1) of the Act, 21 U.S.C.section 360bbb-3(b)(1), unless the authorization is terminated  or revoked sooner.       Influenza A by PCR NEGATIVE NEGATIVE Final   Influenza B by PCR NEGATIVE NEGATIVE Final    Comment: (NOTE) The Xpert Xpress SARS-CoV-2/FLU/RSV plus assay is intended as an aid in the diagnosis of influenza from Nasopharyngeal swab specimens and should not be used as a sole basis for treatment. Nasal washings and aspirates are unacceptable for Xpert Xpress SARS-CoV-2/FLU/RSV testing.  Fact Sheet for Patients: BloggerCourse.com  Fact Sheet for Healthcare Providers: SeriousBroker.it  This test is  not yet approved or cleared by the [place] FDA and has been authorized for detection and/or diagnosis of SARS-CoV-2 by FDA under an Emergency Use Authorization (EUA). This EUA will remain in effect (meaning this test can be used) for the duration of the COVID-19 declaration under Section 564(b)(1) of the Act, 21 U.S.C. section 360bbb-3(b)(1), unless the authorization is terminated or revoked.  Performed at [facility], [address] [place], [place] [address]         Meredeth Ide   [facility] If 7PM-7AM, please contact night-coverage at [email or web address], Office  [phone]   08/30/2020, 9:45 AM  LOS: 1 day

## 2020-08-30 NOTE — Op Note (Signed)
Operative Note  [name] 25 y.o. female [id number]  08/30/2020  Surgeon: Berna Bue MD FACS  Assistant: Carl Best PA-C  Procedure performed: Laparoscopic Cholecystectomy  Procedure classification: urgent  Preop diagnosis: Choledocholithiasis status post ERCP with subsequent post ERCP pancreatitis, resolving Post-op diagnosis/intraop findings: same; bilious ascites throughout the abdomen and notable retroperitoneal edema consistent with active pancreatitis  Specimens: gallbladder  Retained items: none  EBL: minimal  Complications: none  Description of procedure: After obtaining informed consent the patient was brought to the operating room. Antibiotics were administered. SCD's were applied. General endotracheal anesthesia was initiated and a formal time-out was performed. The abdomen was prepped and draped in the usual sterile fashion and the abdomen was entered using an infraumbilical veress needle after instilling the site with local. Insufflation to was obtained, 10mm trocar and camera inserted, and gross inspection revealed no evidence of injury from our entry.  Bilious ascites was noted in the pelvis and bilateral upper quadrants.  The bowel appears mildly dilated and in the central abdomen there is notable retroperitoneal edema consistent with known post ERCP pancreatitis.  Two 23mm trocars were introduced in the right midclavicular and right anterior axillary lines under direct visualization and following infiltration with local. An 75mm trocar was placed in the epigastrium. The gallbladder was retracted cephalad and the infundibulum was retracted laterally. A combination of hook electrocautery and blunt dissection was utilized to clear the peritoneum from the neck and cystic duct, circumferentially isolating the cystic artery and cystic duct and lifting the gallbladder from the cystic plate. The critical view of safety was achieved with the cystic artery, cystic duct,  and liver bed visualized between them with no other structures. The artery was clipped with a single clip proximally and distally and divided as was the cystic duct with three clips on the proximal end. The gallbladder was dissected from the liver plate using electrocautery.  There was a dilated vein entering the gallbladder along the anterior body which was clipped proximally before dividing distally with cautery.  Once freed the gallbladder was placed in an endocatch bag and removed intact through the epigastric trocar site.  The liver bed was hemostatic.  No bile or stones were spilled during the gallbladder dissection. The bilious ascites that was present at the time of surgery was aspirated and the right upper quadrant was irrigated and aspirated, the effluent was clear. Hemostasis was once again confirmed, and reinspection of the abdomen revealed no injuries. The clips were well opposed without any bile leak from the duct or the liver bed. The 77mm trocar site in the epigastrium was closed with a 0 vicryl in the fascia under direct visualization using a PMI device. The abdomen was desufflated and all trocars removed. The skin incisions were closed with running subcuticular monocryl and Dermabond. The patient was awakened, extubated and transported to the recovery room in stable condition.    All counts were correct at the completion of the case.

## 2020-08-30 NOTE — Anesthesia Preprocedure Evaluation (Addendum)
Anesthesia Evaluation  Patient identified by MRN, date of birth, ID band Patient awake    Reviewed: Allergy & Precautions, NPO status , Patient's Chart, lab work & pertinent test results  History of Anesthesia Complications Negative for: history of anesthetic complications  Airway Mallampati: I  TM Distance: >3 FB Neck ROM: Full    Dental  (+) Dental Advisory Given, Teeth Intact   Pulmonary neg pulmonary ROS, neg shortness of breath, neg sleep apnea, neg COPD, neg recent URI,  Covid-19 Nucleic Acid Test Results Lab Results      Component                Value               Date                      SARSCOV2NAA              NEGATIVE            08/27/2020              breath sounds clear to auscultation       Cardiovascular negative cardio ROS   Rhythm:Regular     Neuro/Psych negative neurological ROS  negative psych ROS   GI/Hepatic negative GI ROS, Lab Results      Component                Value               Date                      ALT                      121 (H)             08/30/2020                AST                      38                  08/30/2020                ALKPHOS                  96                  08/30/2020                BILITOT                  3.5 (H)             08/30/2020              Endo/Other  negative endocrine ROS  Renal/GU negative Renal ROS     Musculoskeletal negative musculoskeletal ROS (+)   Abdominal   Peds  Hematology  (+) Blood dyscrasia, anemia , Lab Results      Component                Value               Date                      WBC  13.0 (H)            08/30/2020                HGB                      11.1 (L)            08/30/2020                HCT                      33.7 (L)            08/30/2020                MCV                      96.3                08/30/2020                PLT                      271                 08/30/2020               Anesthesia Other Findings   Reproductive/Obstetrics negative OB ROS Lab Results      Component                Value               Date                      PREGTESTUR               NEGATIVE            07/26/2020                HCG                      <5.0                08/27/2020                                        Anesthesia Physical Anesthesia Plan  ASA: 2  Anesthesia Plan: General   Post-op Pain Management:    Induction: Intravenous  PONV Risk Score and Plan: 3 and Ondansetron and Dexamethasone  Airway Management Planned: Oral ETT  Additional Equipment: None  Intra-op Plan:   Post-operative Plan: Extubation in OR  Informed Consent: I have reviewed the patients History and Physical, chart, labs and discussed the procedure including the risks, benefits and alternatives for the proposed anesthesia with the patient or authorized representative who has indicated his/her understanding and acceptance.     Dental advisory given  Plan Discussed with: Anesthesiologist and CRNA  Anesthesia Plan Comments:        Anesthesia Quick Evaluation

## 2020-08-30 NOTE — Anesthesia Procedure Notes (Signed)
Procedure Name: Intubation Date/Time: 08/30/2020 12:05 PM Performed by: Sheyla Zaffino D, CRNA Pre-anesthesia Checklist: Patient identified, Emergency Drugs available, Suction available and Patient being monitored Patient Re-evaluated:Patient Re-evaluated prior to induction Oxygen Delivery Method: Circle system utilized Preoxygenation: Pre-oxygenation with 100% oxygen Induction Type: IV induction Ventilation: Mask ventilation without difficulty Laryngoscope Size: Mac and 3 Grade View: Grade I Tube type: Oral Tube size: 7.0 mm Number of attempts: 1 Airway Equipment and Method: Stylet Placement Confirmation: ETT inserted through vocal cords under direct vision, positive ETCO2 and breath sounds checked- equal and bilateral Secured at: 21 cm Tube secured with: Tape Dental Injury: Teeth and Oropharynx as per pre-operative assessment

## 2020-08-30 NOTE — Transfer of Care (Signed)
Immediate Anesthesia Transfer of Care Note  Patient: Cynthia Avery  Procedure(s) Performed: LAPAROSCOPIC CHOLECYSTECTOMY (Abdomen)  Patient Location: PACU  Anesthesia Type:General  Level of Consciousness: awake, alert  and oriented  Airway & Oxygen Therapy: Patient Spontanous Breathing and Patient connected to face mask oxygen  Post-op Assessment: Report given to RN and Post -op Vital signs reviewed and stable  Post vital signs: Reviewed and stable  Last Vitals:  Vitals Value Taken Time  BP    Temp    Pulse 83 08/30/20 1316  Resp 24 08/30/20 1316  SpO2 100 % 08/30/20 1316  Vitals shown include unvalidated device data.  Last Pain:  Vitals:   08/30/20 1131  TempSrc: Oral  PainSc: 3       Patients Stated Pain Goal: 2 (08/30/20 0800)  Complications: No notable events documented.

## 2020-08-30 NOTE — Progress Notes (Addendum)
2 Days Post-Op    CC: Abdominal pain  Subjective: Feels better this a.m. but still tender mid epigastric area.  She rates it a 3/10.  No nausea or vomiting.  Objective: Vital signs in last 24 hours: Temp:  [98.6 F (37 C)-99.2 F (37.3 C)] 99.2 F (37.3 C) (08/23 0600) Pulse Rate:  [71-79] 79 (08/23 0600) Resp:  [16] 16 (08/23 0600) BP: (100-108)/(57-62) 100/57 (08/23 0600) SpO2:  [98 %] 98 % (08/23 0600) Last BM Date: 08/26/20 N.p.o. 3708 IV Urine x4 No BM T-max 99.2, vital signs are stable Total bilirubin 6.4>> 5.6>> 3.7>> 3.5 Lipase still pending Intake/Output from previous day: 08/22 0701 - 08/23 0700 In: 3708 [I.V.:3708] Out: -  Intake/Output this shift: No intake/output data recorded.  General appearance: alert, cooperative, and no distress Resp: clear to auscultation bilaterally GI: Soft, tender midepigastric area.  No distention.  Lab Results:  Recent Labs    08/29/20 0402 08/30/20 0422  WBC 13.2* 13.0*  HGB 12.9 11.1*  HCT 38.1 33.7*  PLT 357 271    BMET Recent Labs    08/29/20 0402 08/30/20 0422  NA 137 135  K 4.1 3.6  CL 101 101  CO2 25 27  GLUCOSE 108* 80  BUN 13 12  CREATININE 0.81 0.74  CALCIUM 9.4 9.0   PT/INR No results for input(s): LABPROT, INR in the last 72 hours.  Recent Labs  Lab 08/26/20 2236 08/27/20 1112 08/28/20 0354 08/29/20 0402 08/30/20 0422  AST 219* 616* 244* 99* 38  ALT 76* 453* 329* 227* 121*  ALKPHOS 86 124 130* 120 96  BILITOT 3.8* 6.4* 5.6* 3.7* 3.5*  PROT 8.0 7.6 6.9 7.6 6.4*  ALBUMIN 4.4 4.1 3.8 4.0 3.3*     Lipase     Component Value Date/Time   LIPASE 2,985 (H) 08/29/2020 0402     Medications:  polyethylene glycol  17 g Oral Daily   senna  2 tablet Oral Daily    lactated ringers 200 mL/hr at 08/29/20 2330     Assessment/Plan Choledocholithiasis ERCP 08/28/20  - Total bilirubin 6.4>> 5.6>> 3.7>> 3.5 Post ERCP pancreatitis  -Lipase 25>> 2985>> today's is pending  -Await results  lipase and review with Dr. Doylene Canard.  FEN: N.p.o./IV fluid ID: [id number] times 1   08/27/20 DVT: SCDs  Hx GERD Postpartum/breast-feeding at home/pumping here      LOS: 1 day    Turner Kunzman 08/30/2020 Please see Amion

## 2020-08-31 ENCOUNTER — Encounter (HOSPITAL_COMMUNITY): Payer: Self-pay | Admitting: Surgery

## 2020-08-31 DIAGNOSIS — K8020 Calculus of gallbladder without cholecystitis without obstruction: Secondary | ICD-10-CM | POA: Diagnosis not present

## 2020-08-31 DIAGNOSIS — R7989 Other specified abnormal findings of blood chemistry: Secondary | ICD-10-CM | POA: Diagnosis not present

## 2020-08-31 LAB — COMPREHENSIVE METABOLIC PANEL
ALT: 88 U/L — ABNORMAL HIGH (ref 0–44)
AST: 41 U/L (ref 15–41)
Albumin: 3.1 g/dL — ABNORMAL LOW (ref 3.5–5.0)
Alkaline Phosphatase: 83 U/L (ref 38–126)
Anion gap: 6 (ref 5–15)
BUN: 7 mg/dL (ref 6–20)
CO2: 28 mmol/L (ref 22–32)
Calcium: 8.8 mg/dL — ABNORMAL LOW (ref 8.9–10.3)
Chloride: 104 mmol/L (ref 98–111)
Creatinine, Ser: 0.74 mg/dL (ref 0.44–1.00)
GFR, Estimated: >60 mL/min (ref >60)
Glucose, Bld: 90 mg/dL (ref 70–99)
Potassium: 3.6 mmol/L (ref 3.5–5.1)
Sodium: 138 mmol/L (ref 135–145)
Total Bilirubin: 1.5 mg/dL — ABNORMAL HIGH (ref 0.3–1.2)
Total Protein: 6.1 g/dL — ABNORMAL LOW (ref 6.5–8.1)

## 2020-08-31 LAB — CBC
HCT: 31.1 % — ABNORMAL LOW (ref 36.0–46.0)
Hemoglobin: 10.2 g/dL — ABNORMAL LOW (ref 12.0–15.0)
MCH: 31.5 pg (ref 26.0–34.0)
MCHC: 32.8 g/dL (ref 30.0–36.0)
MCV: 96 fL (ref 80.0–100.0)
Platelets: 234 10*3/uL (ref 150–400)
RBC: 3.24 MIL/uL — ABNORMAL LOW (ref 3.87–5.11)
RDW: 10.8 % — ABNORMAL LOW (ref 11.5–15.5)
WBC: 14.1 10*3/uL — ABNORMAL HIGH (ref 4.0–10.5)
nRBC: 0 % (ref 0.0–0.2)

## 2020-08-31 LAB — SURGICAL PATHOLOGY

## 2020-08-31 LAB — LIPASE, BLOOD: Lipase: 83 U/L — ABNORMAL HIGH (ref 11–51)

## 2020-08-31 MED ORDER — IBUPROFEN 200 MG PO TABS: ORAL_TABLET | ORAL | 0 refills | Status: DC

## 2020-08-31 MED ORDER — IBUPROFEN 400 MG PO TABS: 600.0000 mg | ORAL_TABLET | Freq: Four times a day (QID) | ORAL | Status: DC | PRN

## 2020-08-31 MED ORDER — TRAMADOL HCL 50 MG PO TABS: ORAL_TABLET | ORAL | 0 refills | Status: DC

## 2020-08-31 MED ORDER — ACETAMINOPHEN 500 MG PO TABS
1000.0000 mg | ORAL_TABLET | Freq: Three times a day (TID) | ORAL | Status: DC
  Administered 2020-08-31: 1000 mg via ORAL
  Filled 2020-08-31: qty 2

## 2020-08-31 MED ORDER — ACETAMINOPHEN 500 MG PO TABS: ORAL_TABLET | ORAL | 0 refills | Status: DC

## 2020-08-31 NOTE — Discharge Summary (Signed)
Physician Discharge Summary  Cynthia Avery ZOX:[id number] DOB: [birth year] DOA: 08/26/2020  PCP: Pcp, No  Admit date: 08/26/2020 Discharge date: 08/31/2020  Admitted From: Home Disposition: Home  Recommendations for Outpatient Follow-up:  Follow up with general surgery as an outpatient.  Wound care as per general surgery recommendations. Follow up in ED if symptoms worsen or new appear   Home Health: No Equipment/Devices: None  Discharge Condition: Stable CODE STATUS: Full Diet recommendation: Regular  Brief/Interim Summary: 25 year old female with history of GERD presented with epigastric and right upper quadrant pain.  On presentation, she had abnormal LFTs including elevated bilirubin.  Right upper quadrant ultrasound showed gallbladder sludge without acute cholecystitis.  CT of the abdomen showed biliary dilatation without obvious obstructing lesion.  MRCP of the abdomen showed diminished biliary duct dilatation and distention of the gallbladder compared to the prior study.  ERCP did not show any stone in biliary tree; she underwent successful sphincterotomy and balloon sweep.  She developed post ERCP pancreatitis and was treated with IV fluids and pain management.  She underwent laparoscopic cholecystectomy on 08/30/2020 by general surgery.  General surgery has advanced the diet today.  If she tolerates diet, she will be discharged home today.  Outpatient follow-up with general surgery.  LFTs and lipase are much improved.  Discharge Diagnoses:   Elevated LFTs with possible symptomatic cholelithiasis/choledocholithiasis with passed stone Post ERCP pancreatitis -Presented with elevated LFTs including elevated bilirubin.   -Right upper quadrant ultrasound showed gallbladder sludge without acute cholecystitis.  CT of the abdomen showed biliary dilatation without obvious obstructing lesion.  MRCP of the abdomen showed diminished biliary duct dilatation and distention of the gallbladder compared  to the prior study.  -ERCP did not show any stone in biliary tree; she underwent successful sphincterotomy and balloon sweep.   -She developed post ERCP pancreatitis and was treated with IV fluids and pain management.   -She underwent laparoscopic cholecystectomy on 08/30/2020 by general surgery.  General surgery has advanced the diet today.  If she tolerates diet, she will be discharged home today.  Outpatient follow-up with general surgery.  LFTs and lipase are much improved.   Discharge Instructions Allergies as of 08/31/2020   No Known Allergies      Medication List     STOP taking these medications    famotidine 20 MG tablet Commonly known as: PEPCID       TAKE these medications    acetaminophen 500 MG tablet Commonly known as: TYLENOL You can take 2 tablets every 6 hours as needed for pain.  You can alternate this with ibuprofen for pain not relieved by the Tylenol/acetaminophen.  Do not take more than 4000 mg of Tylenol/acetaminophen per day it can harm your liver.  You can buy this over-the-counter at any drugstore.   ibuprofen 200 MG tablet Commonly known as: ADVIL You can take 2 to 3 tablets every 6 hours as needed for pain.  He can alternate this with plain Tylenol.  You can buy this over-the-counter at any drugstore. What changed:  how much to take how to take this when to take this reasons to take this additional instructions   traMADol 50 MG tablet Commonly known as: ULTRAM You can take 1 tablet every 6 hours as needed for pain not relieved by Tylenol/acetaminophen, and ibuprofen.               Discharge Care Instructions  (From admission, onward)           Start  Ordered   08/31/20 0000  Discharge wound care:       Comments: As per general surgery recommendations   08/31/20 1018             Follow-up Information     [facility] Follow up on 09/20/2020.   Specialty: General Surgery Why: Your appointment is at 9 AM.   Be at the office 30 minutes early for check-in.  Bring photo ID and insurance information. Contact information: [address] [place] [address] [phone]                No Known Allergies  Consultations: GI/general surgery   Procedures/Studies: DG Chest 2 View  Result Date: 08/26/2020 CLINICAL DATA:  Chest pain EXAM: CHEST - 2 VIEW COMPARISON:  None. FINDINGS: The heart size and mediastinal contours are within normal limits. Both lungs are clear. The visualized skeletal structures are unremarkable. IMPRESSION: No active cardiopulmonary disease. Electronically Signed   By: Helyn Numbers M.D.   On: 08/26/2020 22:47   CT ABDOMEN PELVIS W CONTRAST  Result Date: 08/27/2020 CLINICAL DATA:  Acute hepatitis EXAM: CT ABDOMEN AND PELVIS WITH CONTRAST TECHNIQUE: Multidetector CT imaging of the abdomen and pelvis was performed using the standard protocol following bolus administration of intravenous contrast. CONTRAST:  14mL OMNIPAQUE IOHEXOL 350 MG/ML SOLN COMPARISON:  None. FINDINGS: Lower chest: The visualized lung bases are clear. The visualized heart and pericardium are unremarkable. Hepatobiliary: There is mild periportal edema. The gallbladder is distended and there is mild dilation of the a extrahepatic bile duct which measures 8-9 mm in diameter, similar to prior sonogram of 08/27/2020. No pericholecystic inflammatory change. No enhancing intrahepatic mass identified. The hepatic vasculature is patent. Pancreas: Unremarkable Spleen: Unremarkable Adrenals/Urinary Tract: Adrenal glands are unremarkable. Kidneys are normal, without renal calculi, focal lesion, or hydronephrosis. Bladder is unremarkable. Stomach/Bowel: Moderate stool seen throughout the colon. Stomach is within normal limits. Appendix appears normal. No evidence of bowel wall thickening, distention, or inflammatory changes. No free intraperitoneal gas or fluid. Vascular/Lymphatic: No significant vascular  findings are present. No enlarged abdominal or pelvic lymph nodes. Reproductive: Uterus and bilateral adnexa are unremarkable. Other: No abdominal wall hernia.  Rectum unremarkable. Musculoskeletal: No acute bone abnormality. No lytic or blastic bone lesion identified. IMPRESSION: Mild dilation of the biliary tree, similar to that noted on recent sonogram. Though a distal obstructing lesion is not clearly identified, the distal duct is not optimally assessed with this technique. If there is clinical suspicion of a distal obstructing process, such as choledocholithiasis, ERCP or MRCP examination may be more helpful for further evaluation. Mild periportal edema, nonspecific. Moderate stool seen throughout the colon without evidence of obstruction. Electronically Signed   By: Helyn Numbers M.D.   On: 08/27/2020 03:22   MR 3D Recon At Scanner  Result Date: 08/27/2020 CLINICAL DATA:  Jaundice in a 25 year old female reportedly with acute hepatitis. EXAM: MRI ABDOMEN WITHOUT AND WITH CONTRAST (INCLUDING MRCP) TECHNIQUE: Multiplanar multisequence MR imaging of the abdomen was performed both before and after the administration of intravenous contrast. Heavily T2-weighted images of the biliary and pancreatic ducts were obtained, and three-dimensional MRCP images were rendered by post processing. CONTRAST:  41mL GADAVIST GADOBUTROL 1 MMOL/ML IV SOLN COMPARISON:  CT evaluation August 27, 2020. FINDINGS: Lower chest: Incidental imaging of the lung bases is unremarkable by MRI on limited assessment. Hepatobiliary: MRCP images are limited by respiratory motion. There is a potential 3 mm filling defect in the dependent common  bile duct. There is evidence of cholelithiasis. Diminished biliary duct and gallbladder distension since the previous evaluation. Potential sludge layering dependently best seen on image 17 of series 3 as low signal material in the dependent CBD but without focal characteristics. Potential filling defect,  approximately 3 mm, seen on oblique reformatted images (image 191/102) common bile duct caliber now approximately 5 mm as compared to 8 mm. Diminished caliber of intrahepatic biliary ducts and decreased distension of the common bile duct. Diminished periportal edema. No focal, suspicious hepatic lesion. Pancreas: No sign of pancreatic edema. Normal intrinsic T1 signal in the pancreas. Pancreatic duct is nondilated. Spleen:  Normal spleen. Adrenals/Urinary Tract:  Adrenal glands are normal. Symmetric renal enhancement.  No hydronephrosis. Stomach/Bowel: Gastrointestinal tract is unremarkable to the extent evaluated on abdominal MRI. Vascular/Lymphatic: Vascular structures in the abdomen are patent. There is no gastrohepatic or hepatoduodenal ligament lymphadenopathy. No retroperitoneal or mesenteric lymphadenopathy. Other:  No ascites. Musculoskeletal: No suspicious bone lesions identified. IMPRESSION: 1. Diminished biliary duct dilation and distension of the gallbladder compared to the prior study. Correlate with any improvement in symptoms that might indicate recent passage of a larger biliary calculus. 2. Suspected small intraductal calculus in the common bile duct at 3 mm though with limited assessment due to motion artifact on MRCP sequences, perhaps with a small amount of adjacent sludge. 3. Cholelithiasis. 4. Diminished periportal edema. 5. No sign of pancreatic edema. These results will be called to the ordering clinician or representative by the Radiologist Assistant, and communication documented in the PACS or [employer]. Electronically Signed   By: Donzetta Kohut M.D.   On: 08/27/2020 11:32   US Abdomen Limited  Result Date: 08/27/2020 CLINICAL DATA:  Right upper quadrant pain EXAM: ULTRASOUND ABDOMEN LIMITED RIGHT UPPER QUADRANT COMPARISON:  None. FINDINGS: Gallbladder: Very small amount of sludge in the gallbladder. A negative sonographic Eulah Pont sign was reported by the sonographer. No  gallbladder wall thickening or pericholecystic fluid. Common bile duct: Diameter: 9 mm Liver: No focal lesion identified. Within normal limits in parenchymal echogenicity. Portal vein is patent on color Doppler imaging with normal direction of blood flow towards the liver. Other: None. IMPRESSION: Gallbladder sludge without other evidence of acute cholecystitis. Electronically Signed   By: Deatra Robinson M.D.   On: 08/27/2020 01:45   DG ERCP BILIARY & PANCREATIC DUCTS  Result Date: 08/28/2020 CLINICAL DATA:  Abdominal pain EXAM: ERCP ERCP with sphincterotomy TECHNIQUE: Multiple spot images obtained with the fluoroscopic device and submitted for interpretation post-procedure. FLUOROSCOPY TIME:  Fluoroscopy Time:  3 minutes 51 seconds Radiation Exposure Index (if provided by the fluoroscopic device): 32.5 mGy Number of Acquired Spot Images: 6 COMPARISON:  MRI abdomen 08/27/2020 FINDINGS: Submitted intraoperative fluoroscopic images demonstrate cannulation and opacification of the common bile duct. There is a small questionable filling defect in the mid common bile duct on image 4 which is not identified on the final image 6. IMPRESSION: Intraoperative fluoroscopic images of ERCP as above. These images were submitted for radiologic interpretation only. Please see the procedural report for the amount of contrast and the fluoroscopy time utilized. Electronically Signed   By: Acquanetta Belling M.D.   On: 08/28/2020 10:27   MR ABDOMEN MRCP W WO CONTAST  Result Date: 08/27/2020 CLINICAL DATA:  Jaundice in a 25 year old female reportedly with acute hepatitis. EXAM: MRI ABDOMEN WITHOUT AND WITH CONTRAST (INCLUDING MRCP) TECHNIQUE: Multiplanar multisequence MR imaging of the abdomen was performed both before and after the administration of intravenous contrast. Heavily T2-weighted  images of the biliary and pancreatic ducts were obtained, and three-dimensional MRCP images were rendered by post processing. CONTRAST:  71mL  GADAVIST GADOBUTROL 1 MMOL/ML IV SOLN COMPARISON:  CT evaluation August 27, 2020. FINDINGS: Lower chest: Incidental imaging of the lung bases is unremarkable by MRI on limited assessment. Hepatobiliary: MRCP images are limited by respiratory motion. There is a potential 3 mm filling defect in the dependent common bile duct. There is evidence of cholelithiasis. Diminished biliary duct and gallbladder distension since the previous evaluation. Potential sludge layering dependently best seen on image 17 of series 3 as low signal material in the dependent CBD but without focal characteristics. Potential filling defect, approximately 3 mm, seen on oblique reformatted images (image 191/102) common bile duct caliber now approximately 5 mm as compared to 8 mm. Diminished caliber of intrahepatic biliary ducts and decreased distension of the common bile duct. Diminished periportal edema. No focal, suspicious hepatic lesion. Pancreas: No sign of pancreatic edema. Normal intrinsic T1 signal in the pancreas. Pancreatic duct is nondilated. Spleen:  Normal spleen. Adrenals/Urinary Tract:  Adrenal glands are normal. Symmetric renal enhancement.  No hydronephrosis. Stomach/Bowel: Gastrointestinal tract is unremarkable to the extent evaluated on abdominal MRI. Vascular/Lymphatic: Vascular structures in the abdomen are patent. There is no gastrohepatic or hepatoduodenal ligament lymphadenopathy. No retroperitoneal or mesenteric lymphadenopathy. Other:  No ascites. Musculoskeletal: No suspicious bone lesions identified. IMPRESSION: 1. Diminished biliary duct dilation and distension of the gallbladder compared to the prior study. Correlate with any improvement in symptoms that might indicate recent passage of a larger biliary calculus. 2. Suspected small intraductal calculus in the common bile duct at 3 mm though with limited assessment due to motion artifact on MRCP sequences, perhaps with a small amount of adjacent sludge. 3.  Cholelithiasis. 4. Diminished periportal edema. 5. No sign of pancreatic edema. These results will be called to the ordering clinician or representative by the Radiologist Assistant, and communication documented in the PACS or [employer]. Electronically Signed   By: Donzetta Kohut M.D.   On: 08/27/2020 11:32      Subjective: Patient seen and examined at bedside.  Denies worsening abdominal pain, nausea or vomiting.  Tolerating diet.  Wants to go home today.  No overnight fever reported.  Discharge Exam: Vitals:   08/31/20 0055 08/31/20 0600  BP: 105/67 110/76  Pulse: 64 60  Resp: 16 16  Temp: 98.7 F (37.1 C) 98.1 F (36.7 C)  SpO2: 100% 100%    General: Pt is alert, awake, not in acute distress.  Currently on room air. Cardiovascular: rate controlled, S1/S2 + Respiratory: bilateral decreased breath sounds at bases Abdominal: Soft, mildly tender around the port sites; ND, bowel sounds + Extremities: no edema, no cyanosis    The results of significant diagnostics from this hospitalization (including imaging, microbiology, ancillary and laboratory) are listed below for reference.     Microbiology: Recent Results (from the past 240 hour(s))  Resp Panel by RT-PCR (Flu A&B, Covid) Nasopharyngeal Swab     Status: None   Collection Time: 08/27/20  4:20 AM   Specimen: Nasopharyngeal Swab; Nasopharyngeal(NP) swabs in vial transport medium  Result Value Ref Range Status   SARS Coronavirus 2 by RT PCR NEGATIVE NEGATIVE Final    Comment: (NOTE) SARS-CoV-2 target nucleic acids are NOT DETECTED.  The SARS-CoV-2 RNA is generally detectable in upper respiratory specimens during the acute phase of infection. The lowest concentration of SARS-CoV-2 viral copies this assay can detect is 138 copies/mL. A negative result  does not preclude SARS-Cov-2 infection and should not be used as the sole basis for treatment or other patient management decisions. A negative result may occur with   improper specimen collection/handling, submission of specimen other than nasopharyngeal swab, presence of viral mutation(s) within the areas targeted by this assay, and inadequate number of viral copies(<138 copies/mL). A negative result must be combined with clinical observations, patient history, and epidemiological information. The expected result is Negative.  Fact Sheet for Patients:  BloggerCourse.com  Fact Sheet for Healthcare Providers:  SeriousBroker.it  This test is no t yet approved or cleared by the [place] FDA and  has been authorized for detection and/or diagnosis of SARS-CoV-2 by FDA under an Emergency Use Authorization (EUA). This EUA will remain  in effect (meaning this test can be used) for the duration of the COVID-19 declaration under Section 564(b)(1) of the Act, 21 U.S.C.section 360bbb-3(b)(1), unless the authorization is terminated  or revoked sooner.       Influenza A by PCR NEGATIVE NEGATIVE Final   Influenza B by PCR NEGATIVE NEGATIVE Final    Comment: (NOTE) The Xpert Xpress SARS-CoV-2/FLU/RSV plus assay is intended as an aid in the diagnosis of influenza from Nasopharyngeal swab specimens and should not be used as a sole basis for treatment. Nasal washings and aspirates are unacceptable for Xpert Xpress SARS-CoV-2/FLU/RSV testing.  Fact Sheet for Patients: BloggerCourse.com  Fact Sheet for Healthcare Providers: SeriousBroker.it  This test is not yet approved or cleared by the [place] FDA and has been authorized for detection and/or diagnosis of SARS-CoV-2 by FDA under an Emergency Use Authorization (EUA). This EUA will remain in effect (meaning this test can be used) for the duration of the COVID-19 declaration under Section 564(b)(1) of the Act, 21 U.S.C. section 360bbb-3(b)(1), unless the authorization is terminated  or revoked.  Performed at [facility], [address], [place], [place] [address]      Labs: BNP (last 3 results) No results for input(s): BNP in the last 8760 hours. Basic Metabolic Panel: Recent Labs  Lab 08/26/20 2236 08/27/20 1112 08/28/20 0354 08/29/20 0402 08/30/20 0422 08/31/20 0452  NA 137  --  138 137 135 138  K 3.4*  --  4.0 4.1 3.6 3.6  CL 101  --  105 101 101 104  CO2 28  --  28 25 27 28   GLUCOSE 92  --  78 108* 80 90  BUN 13  --  8 13 12 7   CREATININE 0.80  --  0.75 0.81 0.74 0.74  CALCIUM 9.2  --  9.5 9.4 9.0 8.8*  MG  --  2.1  --   --  1.7  --    Liver Function Tests: Recent Labs  Lab 08/27/20 1112 08/28/20 0354 08/29/20 0402 08/30/20 0422 08/31/20 0452  AST 616* 244* 99* 38 41  ALT 453* 329* 227* 121* 88*  ALKPHOS 124 130* 120 96 83  BILITOT 6.4* 5.6* 3.7* 3.5* 1.5*  PROT 7.6 6.9 7.6 6.4* 6.1*  ALBUMIN 4.1 3.8 4.0 3.3* 3.1*   Recent Labs  Lab 08/26/20 2236 08/28/20 0354 08/29/20 0402 08/30/20 0422 08/31/20 0452  LIPASE 36 25 2,985* 1,319* 83*   No results for input(s): AMMONIA in the last 168 hours. CBC: Recent Labs  Lab 08/26/20 2236 08/29/20 0402 08/30/20 0422 08/31/20 0452  WBC 8.9 13.2* 13.0* 14.1*  NEUTROABS 7.4  --   --   --   HGB 12.2 12.9 11.1* 10.2*  HCT 36.9 38.1  33.7* 31.1*  MCV 95.6 93.6 96.3 96.0  PLT 331 357 271 234   Cardiac Enzymes: No results for input(s): CKTOTAL, CKMB, CKMBINDEX, TROPONINI in the last 168 hours. BNP: Invalid input(s): POCBNP CBG: No results for input(s): GLUCAP in the last 168 hours. D-Dimer No results for input(s): DDIMER in the last 72 hours. Hgb A1c No results for input(s): HGBA1C in the last 72 hours. Lipid Profile No results for input(s): CHOL, HDL, LDLCALC, TRIG, CHOLHDL, LDLDIRECT in the last 72 hours. Thyroid function studies No results for input(s): TSH, T4TOTAL, T3FREE, THYROIDAB in the last 72 hours.  Invalid input(s): FREET3 Anemia work up No  results for input(s): VITAMINB12, FOLATE, FERRITIN, TIBC, IRON, RETICCTPCT in the last 72 hours. Urinalysis    Component Value Date/Time   COLORURINE YELLOW 08/01/2017 0931   APPEARANCEUR CLEAR 08/01/2017 0931   LABSPEC <1.005 (L) 08/01/2017 0931   PHURINE 7.0 08/01/2017 0931   GLUCOSEU NEGATIVE 08/01/2017 0931   HGBUR NEGATIVE 08/01/2017 0931   BILIRUBINUR NEGATIVE 08/01/2017 0931   KETONESUR NEGATIVE 08/01/2017 0931   PROTEINUR NEGATIVE 08/01/2017 0931   NITRITE NEGATIVE 08/01/2017 0931   LEUKOCYTESUR NEGATIVE 08/01/2017 0931   Sepsis Labs Invalid input(s): PROCALCITONIN,  WBC,  LACTICIDVEN Microbiology Recent Results (from the past 240 hour(s))  Resp Panel by RT-PCR (Flu A&B, Covid) Nasopharyngeal Swab     Status: None   Collection Time: 08/27/20  4:20 AM   Specimen: Nasopharyngeal Swab; Nasopharyngeal(NP) swabs in vial transport medium  Result Value Ref Range Status   SARS Coronavirus 2 by RT PCR NEGATIVE NEGATIVE Final    Comment: (NOTE) SARS-CoV-2 target nucleic acids are NOT DETECTED.  The SARS-CoV-2 RNA is generally detectable in upper respiratory specimens during the acute phase of infection. The lowest concentration of SARS-CoV-2 viral copies this assay can detect is 138 copies/mL. A negative result does not preclude SARS-Cov-2 infection and should not be used as the sole basis for treatment or other patient management decisions. A negative result may occur with  improper specimen collection/handling, submission of specimen other than nasopharyngeal swab, presence of viral mutation(s) within the areas targeted by this assay, and inadequate number of viral copies(<138 copies/mL). A negative result must be combined with clinical observations, patient history, and epidemiological information. The expected result is Negative.  Fact Sheet for Patients:  BloggerCourse.comhttps://www.fda.gov/media/152166/download  Fact Sheet for Healthcare Providers:   SeriousBroker.ithttps://www.fda.gov/media/152162/download  This test is no t yet approved or cleared by the [place] FDA and  has been authorized for detection and/or diagnosis of SARS-CoV-2 by FDA under an Emergency Use Authorization (EUA). This EUA will remain  in effect (meaning this test can be used) for the duration of the COVID-19 declaration under Section 564(b)(1) of the Act, 21 U.S.C.section 360bbb-3(b)(1), unless the authorization is terminated  or revoked sooner.       Influenza A by PCR NEGATIVE NEGATIVE Final   Influenza B by PCR NEGATIVE NEGATIVE Final    Comment: (NOTE) The Xpert Xpress SARS-CoV-2/FLU/RSV plus assay is intended as an aid in the diagnosis of influenza from Nasopharyngeal swab specimens and should not be used as a sole basis for treatment. Nasal washings and aspirates are unacceptable for Xpert Xpress SARS-CoV-2/FLU/RSV testing.  Fact Sheet for Patients: BloggerCourse.comhttps://www.fda.gov/media/152166/download  Fact Sheet for Healthcare Providers: SeriousBroker.ithttps://www.fda.gov/media/152162/download  This test is not yet approved or cleared by the [place] FDA and has been authorized for detection and/or diagnosis of SARS-CoV-2 by FDA under an Emergency Use Authorization (EUA). This EUA will remain in effect (meaning  this test can be used) for the duration of the COVID-19 declaration under Section 564(b)(1) of the Act, 21 U.S.C. section 360bbb-3(b)(1), unless the authorization is terminated or revoked.  Performed at [facility], [address] [place], [place] [address]      Time coordinating discharge: 35 minutes  SIGNED:   Glade Lloyd, MD  [facility] 08/31/2020, 9:40 AM

## 2020-08-31 NOTE — Plan of Care (Signed)

## 2020-08-31 NOTE — Discharge Instructions (Signed)
CC[facility] ______[facility] LAPAROSCOPIC SURGERY: POST OP INSTRUCTIONS Always review your discharge instruction sheet given to you by the facility where your surgery was performed. IF YOU HAVE DISABILITY OR FAMILY LEAVE FORMS, YOU MUST BRING THEM TO THE OFFICE FOR PROCESSING.   DO NOT GIVE THEM TO YOUR DOCTOR.  A prescription for pain medication may be given to you upon discharge.  Take your pain medication as prescribed, if needed.  If narcotic pain medicine is not needed, then you may take acetaminophen (Tylenol) or ibuprofen (Advil) as needed. Take your usually prescribed medications unless otherwise directed. If you need a refill on your pain medication, please contact your pharmacy.  They will contact our office to request authorization. Prescriptions will not be filled after 5pm or on week-ends. You should follow a light diet the first few days after arrival home, such as soup and crackers, etc.  Be sure to include lots of fluids daily. Most patients will experience some swelling and bruising in the area of the incisions.  Ice packs will help.  Swelling and bruising can take several days to resolve.  It is common to experience some constipation if taking pain medication after surgery.  Increasing fluid intake and taking a stool softener (such as Colace) will usually help or prevent this problem from occurring.  A mild laxative (Milk of Magnesia or Miralax) should be taken according to package instructions if there are no bowel movements after 48 hours. Unless discharge instructions indicate otherwise, you may remove your bandages 24-48 hours after surgery, and you may shower at that time.  You may have steri-strips (small skin tapes) in place directly over the incision.  These strips should be left on the skin for 7-10 days.  If your surgeon used skin glue on the incision, you may shower in 24 hours.  The glue will flake off over the next 2-3 weeks.  Any sutures or staples will be  removed at the office during your follow-up visit. ACTIVITIES:  You may resume regular (light) daily activities beginning the next day--such as daily self-care, walking, climbing stairs--gradually increasing activities as tolerated.  You may have sexual intercourse when it is comfortable.  Refrain from any heavy lifting or straining until approved by your doctor. You may drive when you are no longer taking prescription pain medication, you can comfortably wear a seatbelt, and you can safely maneuver your car and apply brakes. RETURN TO WORK:  __________________________________________________________ You should see your doctor in the office for a follow-up appointment approximately 2-3 weeks after your surgery.  Make sure that you call for this appointment within a day or two after you arrive home to insure a convenient appointment time. OTHER INSTRUCTIONS: __________________________________________________________________________________________________________________________ __________________________________________________________________________________________________________________________ WHEN TO CALL YOUR DOCTOR: Fever over 101.0 Inability to urinate Continued bleeding from incision. Increased pain, redness, or drainage from the incision. Increasing abdominal pain  The clinic staff is available to answer your questions during regular business hours.  Please don't hesitate to call and ask to speak to one of the nurses for clinical concerns.  If you have a medical emergency, go to the nearest emergency room or call 911.  A surgeon from [facility] is always on call at the hospital. [address], [address], [place], [place]  [address] ? [address] [address], [place], [place]   [address] [phone] ? [phone] ? FAX [phone] Web site: [email or web address]  

## 2020-08-31 NOTE — Progress Notes (Addendum)
1 Day Post-Op    CC: Abdominal pain  Subjective: She looks great this morning.  Normal soreness post cholecystectomy, but her pancreatitis seems to have resolved.  She is tolerating some clears from before.  Port sites all look good.  Objective: Vital signs in last 24 hours: Temp:  [98.1 F (36.7 C)-99.4 F (37.4 C)] 98.1 F (36.7 C) (08/24 0600) Pulse Rate:  [60-98] 60 (08/24 0600) Resp:  [16-24] 16 (08/24 0600) BP: (104-123)/(60-76) 110/76 (08/24 0600) SpO2:  [96 %-100 %] 100 % (08/24 0600) Weight:  [54.4 kg] 54.4 kg (08/23 1116) Last BM Date: 08/26/20 240 p.o. 4200 IV 600 urine recorded No BM recorded Afebrile vital signs are stable Labs are all improving WBC stable Intake/Output from previous day: 08/23 0701 - 08/24 0700 In: 4462.5 [P.O.:240; I.V.:4022.5; IV Piggyback:200] Out: 600 [Urine:600] Intake/Output this shift: No intake/output data recorded.  General appearance: alert, cooperative, and no distress Resp: clear to auscultation bilaterally GI: Soft, she is not tender just normal postop soreness.  Port sites all look good.  She is tolerating some limited clears from the floor.  Lab Results:  Recent Labs    08/30/20 0422 08/31/20 0452  WBC 13.0* 14.1*  HGB 11.1* 10.2*  HCT 33.7* 31.1*  PLT 271 234    BMET Recent Labs    08/30/20 0422 08/31/20 0452  NA 135 138  K 3.6 3.6  CL 101 104  CO2 27 28  GLUCOSE 80 90  BUN 12 7  CREATININE 0.74 0.74  CALCIUM 9.0 8.8*   PT/INR No results for input(s): LABPROT, INR in the last 72 hours.  Recent Labs  Lab 08/27/20 1112 08/28/20 0354 08/29/20 0402 08/30/20 0422 08/31/20 0452  AST 616* 244* 99* 38 41  ALT 453* 329* 227* 121* 88*  ALKPHOS 124 130* 120 96 83  BILITOT 6.4* 5.6* 3.7* 3.5* 1.5*  PROT 7.6 6.9 7.6 6.4* 6.1*  ALBUMIN 4.1 3.8 4.0 3.3* 3.1*     Lipase     Component Value Date/Time   LIPASE 83 (H) 08/31/2020 0452     Medications:  enoxaparin (LOVENOX) injection  40 mg Subcutaneous  Q24H   polyethylene glycol  17 g Oral Daily   senna  2 tablet Oral Daily    Assessment/Plan Choledocholithiasis ERCP 08/28/20  - Total bilirubin 6.4>> 5.6>> 3.7>> 3.5>>1.5 Post ERCP pancreatitis  -Lipase 25>> 2985>> 1319>> 83 Laparoscopic cholecystectomy 08/30/2020 Dr. Twana First,  POD #1  -Plan: We will put her on full liquids for breakfast, she does well advance her to a soft diet at lunch.  Convert her over to oral p.o. pain medications.  If she does well she can go home later today if she tolerates lunch. Follow up appointment, pain medicines, and instructions are in the AVS.   FEN: N.p.o. except ice chips ID: [id number] times 1   08/27/20; Ancef preop DVT: SCDs/Lovenox to start today   Hx GERD Postpartum/breast-feeding at home/pumping here       LOS: 2 days    Cynthia Avery 08/31/2020 Please see Amion

## 2020-09-06 ENCOUNTER — Other Ambulatory Visit: Payer: Self-pay

## 2020-09-06 ENCOUNTER — Ambulatory Visit (INDEPENDENT_AMBULATORY_CARE_PROVIDER_SITE_OTHER): Payer: 59 | Admitting: Family Medicine

## 2020-09-06 ENCOUNTER — Encounter: Payer: Self-pay | Admitting: Family Medicine

## 2020-09-06 VITALS — BP 108/60 | HR 70 | Temp 98.0°F | Ht 64.0 in | Wt 121.1 lb

## 2020-09-06 DIAGNOSIS — Z0000 Encounter for general adult medical examination without abnormal findings: Secondary | ICD-10-CM | POA: Diagnosis not present

## 2020-09-06 LAB — COMPREHENSIVE METABOLIC PANEL
ALT: 31 U/L (ref 0–35)
AST: 17 U/L (ref 0–37)
Albumin: 4.1 g/dL (ref 3.5–5.2)
Alkaline Phosphatase: 85 U/L (ref 39–117)
BUN: 16 mg/dL (ref 6–23)
CO2: 28 mEq/L (ref 19–32)
Calcium: 10 mg/dL (ref 8.4–10.5)
Chloride: 103 mEq/L (ref 96–112)
Creatinine, Ser: 0.73 mg/dL (ref 0.40–1.20)
GFR: 115.55 mL/min (ref >60.00)
Glucose, Bld: 83 mg/dL (ref 70–99)
Potassium: 4 mEq/L (ref 3.5–5.1)
Sodium: 139 mEq/L (ref 135–145)
Total Bilirubin: 1.8 mg/dL — ABNORMAL HIGH (ref 0.2–1.2)
Total Protein: 7.7 g/dL (ref 6.0–8.3)

## 2020-09-06 LAB — LIPID PANEL
Cholesterol: 157 mg/dL (ref 0–200)
HDL: 43.5 mg/dL (ref >39.00)
LDL Cholesterol: 98 mg/dL (ref 0–99)
NonHDL: 113.57
Total CHOL/HDL Ratio: 4
Triglycerides: 78 mg/dL (ref 0.0–149.0)
VLDL: 15.6 mg/dL (ref 0.0–40.0)

## 2020-09-06 LAB — CBC
HCT: 35.7 % — ABNORMAL LOW (ref 36.0–46.0)
Hemoglobin: 11.7 g/dL — ABNORMAL LOW (ref 12.0–15.0)
MCHC: 32.7 g/dL (ref 30.0–36.0)
MCV: 94.6 fl (ref 78.0–100.0)
Platelets: 478 10*3/uL — ABNORMAL HIGH (ref 150.0–400.0)
RBC: 3.77 Mil/uL — ABNORMAL LOW (ref 3.87–5.11)
RDW: 12.3 % (ref 11.5–15.5)
WBC: 7.2 10*3/uL (ref 4.0–10.5)

## 2020-09-06 NOTE — Anesthesia Postprocedure Evaluation (Signed)
Anesthesia Post Note  Patient: Cynthia Avery  Procedure(s) Performed: LAPAROSCOPIC CHOLECYSTECTOMY (Abdomen)     Patient location during evaluation: PACU Anesthesia Type: General Level of consciousness: awake and alert Pain management: pain level controlled Vital Signs Assessment: post-procedure vital signs reviewed and stable Respiratory status: spontaneous breathing, nonlabored ventilation, respiratory function stable and patient connected to nasal cannula oxygen Cardiovascular status: blood pressure returned to baseline and stable Postop Assessment: no apparent nausea or vomiting Anesthetic complications: no   No notable events documented.  Last Vitals:  Vitals:   08/31/20 0055 08/31/20 0600  BP: 105/67 110/76  Pulse: 64 60  Resp: 16 16  Temp: 37.1 C 36.7 C  SpO2: 100% 100%    Last Pain:  Vitals:   08/31/20 0900  TempSrc:   PainSc: 0-No pain                 Oumar Marcott

## 2020-09-06 NOTE — Patient Instructions (Signed)
Give us 2-3 business days to get the results of your labs back.   Keep the diet clean and stay active.  I recommend getting the flu shot in mid October. This suggestion would change if the CDC comes out with a different recommendation.   Let us know if you need anything. 

## 2020-09-06 NOTE — Progress Notes (Signed)
Chief Complaint  Patient presents with   New Patient (Initial Visit)     Well Woman Cynthia Avery is here for a complete physical.   Her last physical was >1 year ago.  Current diet: in general, a "healthy" diet. Current exercise: walking. Fatigue out of ordinary? No Seatbelt? Yes  Health Maintenance Pap/HPV- Yes Tetanus- Yes HIV screening- Yes Hep C screening- Yes  Past Medical History:  Diagnosis Date   No known health problems      Past Surgical History:  Procedure Laterality Date   CHOLECYSTECTOMY N/A 08/30/2020   Procedure: LAPAROSCOPIC CHOLECYSTECTOMY;  Surgeon: Berna Bue, MD;  Location: [facility];  Service: General;  Laterality: N/A;   ERCP N/A 08/28/2020   Procedure: ENDOSCOPIC RETROGRADE CHOLANGIOPANCREATOGRAPHY (ERCP);  Surgeon: Iva Boop, MD;  Location: [facility];  Service: Endoscopy;  Laterality: N/A;  balloon sweep no stones seen   SPHINCTEROTOMY  08/28/2020   Procedure: SPHINCTEROTOMY;  Surgeon: Iva Boop, MD;  Location: [facility];  Service: Endoscopy;;    Medications  Takes no meds routinely.     Allergies No Known Allergies  Review of Systems: Constitutional:  no unexpected weight changes Eye:  no recent significant change in vision Ear/Nose/Mouth/Throat:  Ears:  no tinnitus or vertigo and no recent change in hearing Nose/Mouth/Throat:  no complaints of nasal congestion, no sore throat Cardiovascular: no chest pain Respiratory:  no cough and no shortness of breath Gastrointestinal:  no abdominal pain, no change in bowel habits GU:  Female: negative for dysuria or pelvic pain Musculoskeletal/Extremities:  no pain of the joints Integumentary (Skin/Breast):  no abnormal skin lesions reported Neurologic:  no headaches Endocrine:  denies fatigue Hematologic/Lymphatic:  No areas of easy bleeding  Exam BP 108/60   Pulse 70   Temp 98 F (36.7 C) (Oral)   Ht 5\' 4"  (1.626 m)   Wt 121 lb 2 oz (54.9 kg)   SpO2 99%   BMI 20.79 kg/m   General:  well developed, well nourished, in no apparent distress Skin: Well healing port sites on abd; otherwise no significant moles, warts, or growths Head:  no masses, lesions, or tenderness Eyes:  pupils equal and round, sclera anicteric without injection Ears:  canals without lesions, TMs shiny without retraction, no obvious effusion, no erythema Nose:  nares patent, septum midline, mucosa normal, and no drainage or sinus tenderness Throat/Pharynx:  lips and gingiva without lesion; tongue and uvula midline; non-inflamed pharynx; no exudates or postnasal drainage Neck: neck supple without adenopathy, thyromegaly, or masses Lungs:  clear to auscultation, breath sounds equal bilaterally, no respiratory distress Cardio:  regular rate and rhythm, no bruits, no LE edema Abdomen:  abdomen soft, nontender; bowel sounds normal; no masses or organomegaly Genital: Defer to GYN Musculoskeletal:  symmetrical muscle groups noted without atrophy or deformity Extremities:  no clubbing, cyanosis, or edema, no deformities, no skin discoloration Neuro:  gait normal; deep tendon reflexes normal and symmetric Psych: well oriented with normal range of affect and appropriate judgment/insight  Assessment and Plan  Well adult exam - Plan: CBC, Comprehensive metabolic panel, Lipid panel   Well 25 y.o. female. Counseled on diet and exercise. Flu shot rec'd mid Oct.  Other orders as above. Follow up in 1 yr or prn. The patient voiced understanding and agreement to the plan.  [place], DO 09/06/20 8:50 AM

## 2021-06-06 ENCOUNTER — Other Ambulatory Visit (HOSPITAL_COMMUNITY)
Admission: RE | Admit: 2021-06-06 | Discharge: 2021-06-06 | Disposition: A | Discharge status: Home / Self Care | Payer: 59 | Source: Ambulatory Visit | Attending: Family Medicine | Admitting: Family Medicine

## 2021-06-06 ENCOUNTER — Encounter: Payer: Self-pay | Admitting: Family Medicine

## 2021-06-06 ENCOUNTER — Ambulatory Visit (INDEPENDENT_AMBULATORY_CARE_PROVIDER_SITE_OTHER): Payer: 59 | Admitting: Family Medicine

## 2021-06-06 VITALS — BP 108/62 | HR 68 | Temp 98.4°F | Ht 64.0 in | Wt 124.4 lb

## 2021-06-06 DIAGNOSIS — N926 Irregular menstruation, unspecified: Secondary | ICD-10-CM

## 2021-06-06 DIAGNOSIS — Z113 Encounter for screening for infections with a predominantly sexual mode of transmission: Secondary | ICD-10-CM | POA: Insufficient documentation

## 2021-06-06 DIAGNOSIS — Z114 Encounter for screening for human immunodeficiency virus [HIV]: Secondary | ICD-10-CM | POA: Diagnosis not present

## 2021-06-06 NOTE — Patient Instructions (Signed)
Give us 4-5 business days to get the results of your labs back.   Practice safe sex.   Let us know if you need anything.  

## 2021-06-06 NOTE — Progress Notes (Signed)
Chief Complaint  Patient presents with   Exposure to STD    Subjective: Patient is a 26 y.o. female here for STD testing.  Patient had unprotected intercourse with a partner 1 month ago.  That partner told her that he had been active with another partner who was undergoing STD testing.  No specific diagnosis was made.  She is not having any symptoms such as vaginal discharge, bleeding, fevers, abdominal pain, skin lesions, dysuria, sore throat, or joint pain.  She is also a few days late with her cycle.  Would like to be tested for pregnancy.  Past Medical History:  Diagnosis Date   No known health problems     Objective: BP 108/62   Pulse 68   Temp 98.4 F (36.9 C) (Oral)   Ht 5\' 4"  (1.626 m)   Wt 124 lb 6 oz (56.4 kg)   SpO2 99%   BMI 21.35 kg/m  General: Awake, appears stated age Heart: RRR Abdomen: Bowel sounds present, nontender, nondistended, no masses or organomegaly Lungs: CTAB, no rales, wheezes or rhonchi. No accessory muscle use Psych: Age appropriate judgment and insight, normal affect and mood  Assessment and Plan: Routine screening for STI (sexually transmitted infection) - Plan: Urine cytology ancillary only([place])  Screening for HIV (human immunodeficiency virus) - Plan: HIV Antibody (routine testing w rflx)  Missed period - Plan: Pregnancy, urine  Practice safe sex, check above.  We will treat accordingly to the results.  Follow-up as originally scheduled. The patient voiced understanding and agreement to the plan.  [place], DO 06/06/21  4:38 PM

## 2021-06-07 LAB — HIV ANTIBODY (ROUTINE TESTING W REFLEX): HIV 1&2 Ab, 4th Generation: NONREACTIVE

## 2021-06-07 LAB — PREGNANCY, URINE: Preg Test, Ur: NEGATIVE

## 2021-06-08 LAB — URINE CYTOLOGY ANCILLARY ONLY
Chlamydia: NEGATIVE
Comment: NEGATIVE
Comment: NEGATIVE
Comment: NORMAL
Neisseria Gonorrhea: NEGATIVE
Trichomonas: NEGATIVE

## 2021-06-27 ENCOUNTER — Encounter: Payer: Self-pay | Admitting: Family Medicine

## 2021-07-17 ENCOUNTER — Ambulatory Visit (INDEPENDENT_AMBULATORY_CARE_PROVIDER_SITE_OTHER): Payer: 59 | Admitting: Family Medicine

## 2021-07-17 ENCOUNTER — Encounter: Payer: Self-pay | Admitting: Family Medicine

## 2021-07-17 VITALS — BP 108/68 | HR 63 | Temp 98.4°F | Ht 64.0 in | Wt 124.4 lb

## 2021-07-17 DIAGNOSIS — Z30013 Encounter for initial prescription of injectable contraceptive: Secondary | ICD-10-CM | POA: Diagnosis not present

## 2021-07-17 LAB — POCT PREGNANCY, URINE

## 2021-07-17 MED ORDER — MEDROXYPROGESTERONE ACETATE 150 MG/ML IM SUSP
150.0000 mg | Freq: Once | INTRAMUSCULAR | Status: AC
  Administered 2021-07-17: 150 mg via INTRAMUSCULAR

## 2021-07-17 NOTE — Patient Instructions (Addendum)
Please use back up contraception for 7 days just to be on the safe side.   Let us know if you need anything.

## 2021-07-17 NOTE — Progress Notes (Signed)
Chief Complaint  Patient presents with   Contraception    Subjective: Patient is a 26 y.o. female here for for discussion of contraception.   Patient has 1 child currently.  She does not want any more at this time.  She is interested in contraception.  She is particularly interested in the Depo injection.  She does not like the responsibility of an oral contraceptive pill and does not desire any topical or implant/IUD.  Her last cycle was around 8 days ago.  Past Medical History:  Diagnosis Date   No known health problems     Objective: BP 108/68   Pulse 63   Temp 98.4 F (36.9 C) (Oral)   Ht 5\' 4"  (1.626 m)   Wt 124 lb 6 oz (56.4 kg)   SpO2 99%   BMI 21.35 kg/m  General: Awake, appears stated age Lungs: No accessory muscle use Psych: Age appropriate judgment and insight, normal affect and mood  Assessment and Plan: Encounter for initial prescription of injectable contraceptive - Plan: POCT Pregnancy, Urine, medroxyPROGESTERone (DEPO-PROVERA) injection 150 mg  Urine pregnancy negative.  We will start Depo injections for up to the next 2 years.  Discussed decreased bone density in addition to IUDs, OCPs, Nexplanon, NuvaRing, patches, and abstinence.  Discussed common side effects with Depo-Provera injections and strategies to mitigate some of these. The patient voiced understanding and agreement to the plan.  I spent 25 minutes with the patient discussing the above plan and reviewing her chart on the same day of the visit.  [place], DO 07/17/21  9:11 AM

## 2021-08-21 ENCOUNTER — Ambulatory Visit (INDEPENDENT_AMBULATORY_CARE_PROVIDER_SITE_OTHER): Payer: 59 | Admitting: Family Medicine

## 2021-08-21 ENCOUNTER — Other Ambulatory Visit (HOSPITAL_COMMUNITY)
Admission: RE | Admit: 2021-08-21 | Discharge: 2021-08-21 | Disposition: A | Discharge status: Home / Self Care | Payer: 59 | Source: Ambulatory Visit | Attending: Family Medicine | Admitting: Family Medicine

## 2021-08-21 ENCOUNTER — Encounter: Payer: Self-pay | Admitting: Family Medicine

## 2021-08-21 VITALS — BP 110/60 | HR 92 | Temp 98.7°F | Ht 64.0 in | Wt 125.2 lb

## 2021-08-21 DIAGNOSIS — N898 Other specified noninflammatory disorders of vagina: Secondary | ICD-10-CM

## 2021-08-21 MED ORDER — METRONIDAZOLE 500 MG PO TABS: 500.0000 mg | ORAL_TABLET | Freq: Two times a day (BID) | ORAL | 0 refills | Status: AC

## 2021-08-21 NOTE — Patient Instructions (Signed)
No alcohol on this medication.  Stay hydrated.  Give [place] 4-5 business days to get the results of your labs back.   Let us know if you need anything.

## 2021-08-21 NOTE — Progress Notes (Signed)
Chief Complaint  Patient presents with   Exposure to STD    Cynthia Avery is a 26 y.o. female here for vaginal discharge.  Duration: 1 month Description of discharge: yellowish Odor: Yes New sexual partner: No Urinary complaints: No IUD? No Denies fevers, bleeding, pregnancy abdominal pain.  Past Medical History:  Diagnosis Date   No known health problems    Family History  Problem Relation Age of Onset   Cancer Neg Hx    Heart disease Neg Hx     BP 110/60   Pulse 92   Temp 98.7 F (37.1 C) (Oral)   Ht 5\' 4"  (1.626 m)   Wt 125 lb 4 oz (56.8 kg)   SpO2 98%   BMI 21.50 kg/m  Gen: Awake, alert, appears stated age Heart: RRR Lungs: CTAB, no accessory muscle use Abd: BS+, soft, NT, ND, no masses or organomegaly GU: deferred Psych: Age appropriate judgment and insight, nml mood and affect  Vaginal discharge - Plan: metroNIDAZOLE (FLAGYL) 500 MG tablet, Cervicovaginal ancillary only( [place])  7 d of Flagyl given hx. Ck vag ancillary, self-swab today.  F/u prn. Pt voiced understanding and agreement to the plan.  [place], DO 08/21/21 8:04 AM

## 2021-08-22 LAB — CERVICOVAGINAL ANCILLARY ONLY
Bacterial Vaginitis (gardnerella): POSITIVE — AB
Candida Glabrata: NEGATIVE
Candida Vaginitis: NEGATIVE
Chlamydia: NEGATIVE
Comment: NEGATIVE
Comment: NEGATIVE
Comment: NEGATIVE
Comment: NEGATIVE
Comment: NEGATIVE
Comment: NORMAL
Neisseria Gonorrhea: NEGATIVE
Trichomonas: NEGATIVE

## 2021-09-08 ENCOUNTER — Encounter: Payer: 59 | Admitting: Family Medicine

## 2021-09-15 ENCOUNTER — Encounter: Payer: Self-pay | Admitting: Family Medicine

## 2021-09-15 ENCOUNTER — Ambulatory Visit (INDEPENDENT_AMBULATORY_CARE_PROVIDER_SITE_OTHER): Payer: 59 | Admitting: Family Medicine

## 2021-09-15 VITALS — BP 108/68 | HR 81 | Temp 99.2°F | Ht 64.0 in | Wt 124.1 lb

## 2021-09-15 DIAGNOSIS — S46812A Strain of other muscles, fascia and tendons at shoulder and upper arm level, left arm, initial encounter: Secondary | ICD-10-CM | POA: Diagnosis not present

## 2021-09-15 DIAGNOSIS — Z0000 Encounter for general adult medical examination without abnormal findings: Secondary | ICD-10-CM

## 2021-09-15 NOTE — Progress Notes (Signed)
Chief Complaint  Patient presents with   Annual Exam     Well Woman Cynthia Avery is here for a complete physical.   Her last physical was >1 year ago.  Current diet: in general, a "healthy" diet. Current exercise: none. Contraception? Yes- Depo injection Fatigue out of ordinary? No Seatbelt? Yes Advanced directive? No  Health Maintenance Pap/HPV- Yes Tetanus- Yes HIV screening- Yes Hep C screening- Yes  Left shoulder pain The patient describes an aching pain that has been going on of her left shoulder for the past 2 years.  No specific injury or change in activity.  She has no decreased range of motion.  It gets worse when she is braiding hair or lifting her arm for period of time.  No redness, bruising, swelling, or neurologic signs or symptoms.  Past Medical History:  Diagnosis Date   No known health problems      Past Surgical History:  Procedure Laterality Date   CHOLECYSTECTOMY N/A 08/30/2020   Procedure: LAPAROSCOPIC CHOLECYSTECTOMY;  Surgeon: Berna Bue, MD;  Location: [facility];  Service: General;  Laterality: N/A;   ERCP N/A 08/28/2020   Procedure: ENDOSCOPIC RETROGRADE CHOLANGIOPANCREATOGRAPHY (ERCP);  Surgeon: Iva Boop, MD;  Location: [facility];  Service: Endoscopy;  Laterality: N/A;  balloon sweep no stones seen   SPHINCTEROTOMY  08/28/2020   Procedure: SPHINCTEROTOMY;  Surgeon: Iva Boop, MD;  Location: [facility];  Service: Endoscopy;;    Medications  Takes no meds routinely.    Allergies No Known Allergies  Review of Systems: Constitutional:  no unexpected weight changes Eye:  no recent significant change in vision Ear/Nose/Mouth/Throat:  Ears:  no tinnitus or vertigo and no recent change in hearing Nose/Mouth/Throat:  no complaints of nasal congestion, no sore throat Cardiovascular: no chest pain Respiratory:  no cough and no shortness of breath Gastrointestinal:  no abdominal pain, no change in bowel habits GU:  Female: negative  for dysuria or pelvic pain Musculoskeletal/Extremities: +L shoulder pain for a few years, comes and goes Integumentary (Skin/Breast):  no abnormal skin lesions reported Neurologic:  no headaches Endocrine:  denies fatigue Hematologic/Lymphatic:  No areas of easy bleeding  Exam BP 108/68   Pulse 81   Temp 99.2 F (37.3 C) (Oral)   Ht 5\' 4"  (1.626 m)   Wt 124 lb 2 oz (56.3 kg)   SpO2 99%   BMI 21.31 kg/m  General:  well developed, well nourished, in no apparent distress Skin:  no significant moles, warts, or growths Head:  no masses, lesions, or tenderness Eyes:  pupils equal and round, sclera anicteric without injection Ears:  canals without lesions, TMs shiny without retraction, no obvious effusion, no erythema Nose:  nares patent, septum midline, mucosa normal, and no drainage or sinus tenderness Throat/Pharynx:  lips and gingiva without lesion; tongue and uvula midline; non-inflamed pharynx; no exudates or postnasal drainage Neck: neck supple without adenopathy, thyromegaly, or masses Lungs:  clear to auscultation, breath sounds equal bilaterally, no respiratory distress Cardio:  regular rate and rhythm, no bruits, no LE edema Abdomen:  abdomen soft, nontender; bowel sounds normal; no masses or organomegaly Genital: Defer to GYN Musculoskeletal: TTP over the left trapezius muscle, normal active and passive range of motion of the left shoulder, no deformity or asymmetry, no TTP over the deltoid region, AC joint, or [place] joint, negative Neer's, empty can, [place], crossover, liftoff, speeds; symmetrical muscle groups noted without atrophy or deformity Extremities:  no clubbing, cyanosis, or edema, no deformities, no  skin discoloration Neuro:  gait normal; deep tendon reflexes normal and symmetric Psych: well oriented with normal range of affect and appropriate judgment/insight  Assessment and Plan  Well adult exam - Plan: CBC, Comprehensive metabolic panel, Lipid panel  Strain of  left trapezius muscle, initial encounter   Well 26 y.o. female. Counseled on diet and exercise. Advanced directive form provided today.  Other orders as above. Trapezius strain: Chronic, uncontrolled.  Tylenol, ice, anti-inflammatories, heat, stretches and exercises.  Physical therapy if no better in 4 to 6 weeks. Follow up in 1 yr. The patient voiced understanding and agreement to the plan.  Jilda Roche [place], DO 09/15/21 3:44 PM

## 2021-09-15 NOTE — Patient Instructions (Signed)
Give [place] 2-3 business days to get the results of your labs back.   Keep the diet clean and stay active.  I recommend getting the flu shot in mid October. This suggestion would change if the CDC comes out with a different recommendation.   Send me a message in 1 month if no improvement with the shoulder region.  Please get me a copy of your advanced directive form at your convenience.   Let us know if you need anything.  Trapezius stretches/exercises Do exercises exactly as told by your health care provider and adjust them as directed. It is normal to feel mild stretching, pulling, tightness, or discomfort as you do these exercises, but you should stop right away if you feel sudden pain or your pain gets worse.   Stretching and range of motion exercises These exercises warm up your muscles and joints and improve the movement and flexibility of your shoulder. These exercises can also help to relieve pain, numbness, and tingling. If you are unable to do any of the following for any reason, do not further attempt to do it.   Exercise A: Flexion, standing     Stand and hold a broomstick, a cane, or a similar object. Place your hands a little more than shoulder-width apart on the object. Your left / right hand should be palm-up, and your other hand should be palm-down. Push the stick to raise your left / right arm out to your side and then over your head. Use your other hand to help move the stick. Stop when you feel a stretch in your shoulder, or when you reach the angle that is recommended by your health care provider. Avoid shrugging your shoulder while you raise your arm. Keep your shoulder blade tucked down toward your spine. Hold for 30 seconds. Slowly return to the starting position. Repeat 2 times. Complete this exercise 3 times per week.  Exercise B: Abduction, supine     Lie on your back and hold a broomstick, a cane, or a similar object. Place your hands a little more than  shoulder-width apart on the object. Your left / right hand should be palm-up, and your other hand should be palm-down. Push the stick to raise your left / right arm out to your side and then over your head. Use your other hand to help move the stick. Stop when you feel a stretch in your shoulder, or when you reach the angle that is recommended by your health care provider. Avoid shrugging your shoulder while you raise your arm. Keep your shoulder blade tucked down toward your spine. Hold for 30 seconds. Slowly return to the starting position. Repeat 2 times. Complete this exercise 3 times per week.  Exercise C: Flexion, active-assisted     Lie on your back. You may bend your knees for comfort. Hold a broomstick, a cane, or a similar object. Place your hands about shoulder-width apart on the object. Your palms should face toward your feet. Raise the stick and move your arms over your head and behind your head, toward the floor. Use your healthy arm to help your left / right arm move farther. Stop when you feel a gentle stretch in your shoulder, or when you reach the angle where your health care provider tells you to stop. Hold for 30 seconds. Slowly return to the starting position. Repeat 2 times. Complete this exercise 3 times per week.  Exercise D: External rotation and abduction     Stand in  a door frame with one of your feet slightly in front of the other. This is called a staggered stance. Choose one of the following positions as told by your health care provider: Place your hands and forearms on the door frame above your head. Place your hands and forearms on the door frame at the height of your head. Place your hands on the door frame at the height of your elbows. Slowly move your weight onto your front foot until you feel a stretch across your chest and in the front of your shoulders. Keep your head and chest upright and keep your abdominal muscles tight. Hold for 30 seconds. To  release the stretch, shift your weight to your back foot. Repeat 2 times. Complete this stretch 3 times per week.  Strengthening exercises These exercises build strength and endurance in your shoulder. Endurance is the ability to use your muscles for a long time, even after your muscles get tired. Exercise E: Scapular depression and adduction  Sit on a stable chair. Support your arms in front of you with pillows, armrests, or a tabletop. Keep your elbows in line with the sides of your body. Gently move your shoulder blades down toward your middle back. Relax the muscles on the tops of your shoulders and in the back of your neck. Hold for 3 seconds. Slowly release the tension and relax your muscles completely before doing this exercise again. Repeat for a total of 10 repetitions. After you have practiced this exercise, try doing the exercise without the arm support. Then, try the exercise while standing instead of sitting. Repeat 2 times. Complete this exercise 3 times per week.  Exercise F: Shoulder abduction, isometric     Stand or sit about 4-6 inches (10-15 cm) from a wall with your left / right side facing the wall. Bend your left / right elbow and gently press your elbow against the wall. Increase the pressure slowly until you are pressing as hard as you can without shrugging your shoulder. Hold for 3 seconds. Slowly release the tension and relax your muscles completely. Repeat for a total of 10 repetitions. Repeat 2 times. Complete this exercise 3 times per week.  Exercise G: Shoulder flexion, isometric     Stand or sit about 4-6 inches (10-15 cm) away from a wall with your left / right side facing the wall. Keep your left / right elbow straight and gently press the top of your fist against the wall. Increase the pressure slowly until you are pressing as hard as you can without shrugging your shoulder. Hold for 10-15 seconds. Slowly release the tension and relax your muscles  completely. Repeat for a total of 10 repetitions. Repeat 2 times. Complete this exercise 3 times per week.  Exercise H: Internal rotation     Sit in a stable chair without armrests, or stand. Secure an exercise band at your left / right side, at elbow height. Place a soft object, such as a folded towel or a small pillow, under your left / right upper arm so your elbow is a few inches (about 8 cm) away from your side. Hold the end of the exercise band so the band stretches. Keeping your elbow pressed against the soft object under your arm, move your forearm across your body toward your abdomen. Keep your body steady so the movement is only coming from your shoulder. Hold for 3 seconds. Slowly return to the starting position. Repeat for a total of 10 repetitions. Repeat 2  times. Complete this exercise 3 times per week.  Exercise I: External rotation     Sit in a stable chair without armrests, or stand. Secure an exercise band at your left / right side, at elbow height. Place a soft object, such as a folded towel or a small pillow, under your left / right upper arm so your elbow is a few inches (about 8 cm) away from your side. Hold the end of the exercise band so the band stretches. Keeping your elbow pressed against the soft object under your arm, move your forearm out, away from your abdomen. Keep your body steady so the movement is only coming from your shoulder. Hold for 3 seconds. Slowly return to the starting position. Repeat for a total of 10 repetitions. Repeat 2 times. Complete this exercise 3 times per week. Exercise J: Shoulder extension  Sit in a stable chair without armrests, or stand. Secure an exercise band to a stable object in front of you so the band is at shoulder height. Hold one end of the exercise band in each hand. Your palms should face each other. Straighten your elbows and lift your hands up to shoulder height. Step back, away from the secured end of the exercise  band, until the band stretches. Squeeze your shoulder blades together and pull your hands down to the sides of your thighs. Stop when your hands are straight down by your sides. Do not let your hands go behind your body. Hold for 3 seconds. Slowly return to the starting position. Repeat for a total of 10 repetitions. Repeat 2 times. Complete this exercise 3 times per week.  Exercise K: Shoulder extension, prone     Lie on your abdomen on a firm surface so your left / right arm hangs over the edge. Hold a 5 lb weight in your hand so your palm faces in toward your body. Your arm should be straight. Squeeze your shoulder blade down toward the middle of your back. Slowly raise your arm behind you, up to the height of the surface that you are lying on. Keep your arm straight. Hold for 3 seconds. Slowly return to the starting position and relax your muscles. Repeat for a total of 10 repetitions. Repeat 2 times. Complete this exercise 3 times per week.   Exercise L: Horizontal abduction, prone  Lie on your abdomen on a firm surface so your left / right arm hangs over the edge. Hold a 5 lb weight in your hand so your palm faces toward your feet. Your arm should be straight. Squeeze your shoulder blade down toward the middle of your back. Bend your elbow so your hand moves up, until your elbow is bent to an "L" shape (90 degrees). With your elbow bent, slowly move your forearm forward and up. Raise your hand up to the height of the surface that you are lying on. Your upper arm should not move, and your elbow should stay bent. At the top of the movement, your palm should face the floor. Hold for 3 seconds. Slowly return to the starting position and relax your muscles. Repeat for a total of 10 repetitions. Repeat 2 times. Complete this exercise 3 times per week.  Exercise M: Horizontal abduction, standing  Sit on a stable chair, or stand. Secure an exercise band to a stable object in front of you  so the band is at shoulder height. Hold one end of the exercise band in each hand. Straighten your elbows and lift  your hands straight in front of you, up to shoulder height. Your palms should face down, toward the floor. Step back, away from the secured end of the exercise band, until the band stretches. Move your arms out to your sides, and keep your arms straight. Hold for 3 seconds. Slowly return to the starting position. Repeat for a total of 10 repetitions. Repeat 2 times. Complete this exercise 3 times per week.  Exercise N: Scapular retraction and elevation  Sit on a stable chair, or stand. Secure an exercise band to a stable object in front of you so the band is at shoulder height. Hold one end of the exercise band in each hand. Your palms should face each other. Sit in a stable chair without armrests, or stand. Step back, away from the secured end of the exercise band, until the band stretches. Squeeze your shoulder blades together and lift your hands over your head. Keep your elbows straight. Hold for 3 seconds. Slowly return to the starting position. Repeat for a total of 10 repetitions. Repeat 2 times. Complete this exercise 3 times per week.  This information is not intended to replace advice given to you by your health care provider. Make sure you discuss any questions you have with your health care provider. Document Released: 12/25/2004 Document Revised: 09/01/2015 Document Reviewed: 11/11/2014 Elsevier Interactive Patient Education  2017 [employer].

## 2021-09-16 LAB — COMPREHENSIVE METABOLIC PANEL
AG Ratio: 1.5 (calc) (ref 1.0–2.5)
ALT: 20 U/L (ref 6–29)
AST: 16 U/L (ref 10–30)
Albumin: 4.4 g/dL (ref 3.6–5.1)
Alkaline phosphatase (APISO): 52 U/L (ref 31–125)
BUN/Creatinine Ratio: 10 (calc) (ref 6–22)
BUN: 10 mg/dL (ref 7–25)
CO2: 26 mmol/L (ref 20–32)
Calcium: 9.6 mg/dL (ref 8.6–10.2)
Chloride: 107 mmol/L (ref 98–110)
Creat: 1.02 mg/dL — ABNORMAL HIGH (ref 0.50–0.96)
Globulin: 3 g/dL (calc) (ref 1.9–3.7)
Glucose, Bld: 74 mg/dL (ref 65–99)
Potassium: 4.2 mmol/L (ref 3.5–5.3)
Sodium: 141 mmol/L (ref 135–146)
Total Bilirubin: 2.1 mg/dL — ABNORMAL HIGH (ref 0.2–1.2)
Total Protein: 7.4 g/dL (ref 6.1–8.1)

## 2021-09-16 LAB — LIPID PANEL
Cholesterol: 112 mg/dL (ref <200)
HDL: 48 mg/dL — ABNORMAL LOW (ref >=50)
LDL Cholesterol (Calc): 52 mg/dL (calc)
Non-HDL Cholesterol (Calc): 64 mg/dL (calc) (ref <130)
Total CHOL/HDL Ratio: 2.3 (calc) (ref <5.0)
Triglycerides: 42 mg/dL (ref <150)

## 2021-09-16 LAB — CBC
HCT: 36 % (ref 35.0–45.0)
Hemoglobin: 12.1 g/dL (ref 11.7–15.5)
MCH: 31.9 pg (ref 27.0–33.0)
MCHC: 33.6 g/dL (ref 32.0–36.0)
MCV: 95 fL (ref 80.0–100.0)
MPV: 10 fL (ref 7.5–12.5)
Platelets: 355 10*3/uL (ref 140–400)
RBC: 3.79 10*6/uL — ABNORMAL LOW (ref 3.80–5.10)
RDW: 10.5 % — ABNORMAL LOW (ref 11.0–15.0)
WBC: 5.1 10*3/uL (ref 3.8–10.8)

## 2021-10-10 ENCOUNTER — Ambulatory Visit (INDEPENDENT_AMBULATORY_CARE_PROVIDER_SITE_OTHER): Payer: 59 | Admitting: *Deleted

## 2021-10-10 DIAGNOSIS — Z3042 Encounter for surveillance of injectable contraceptive: Secondary | ICD-10-CM

## 2021-10-10 MED ORDER — MEDROXYPROGESTERONE ACETATE 150 MG/ML IM SUSP
150.0000 mg | Freq: Once | INTRAMUSCULAR | Status: AC
  Administered 2021-10-10: 150 mg via INTRAMUSCULAR

## 2021-10-10 NOTE — Progress Notes (Signed)
Patient here for Depo Provera injection.  Patient is in her window (10/02/21-10/16/21).  Injection given in right upper outer quadrant and patient tolerated well.

## 2021-10-11 ENCOUNTER — Encounter: Payer: Self-pay | Admitting: Family Medicine

## 2021-10-13 ENCOUNTER — Ambulatory Visit: Payer: 59 | Admitting: Family Medicine

## 2021-10-13 ENCOUNTER — Ambulatory Visit (INDEPENDENT_AMBULATORY_CARE_PROVIDER_SITE_OTHER): Payer: 59 | Admitting: Family Medicine

## 2021-10-13 ENCOUNTER — Encounter: Payer: Self-pay | Admitting: Family Medicine

## 2021-10-13 VITALS — BP 102/60 | HR 79 | Temp 98.5°F | Ht 64.0 in | Wt 121.0 lb

## 2021-10-13 DIAGNOSIS — R300 Dysuria: Secondary | ICD-10-CM | POA: Diagnosis not present

## 2021-10-13 LAB — URINALYSIS, ROUTINE W REFLEX MICROSCOPIC
Ketones, ur: NEGATIVE
Nitrite: NEGATIVE
Specific Gravity, Urine: 1.025 (ref 1.000–1.030)
Urine Glucose: NEGATIVE
Urobilinogen, UA: 4 — AB (ref 0.0–1.0)
pH: 6 (ref 5.0–8.0)

## 2021-10-13 MED ORDER — NITROFURANTOIN MONOHYD MACRO 100 MG PO CAPS: 100.0000 mg | ORAL_CAPSULE | Freq: Two times a day (BID) | ORAL | 0 refills | Status: AC

## 2021-10-13 MED ORDER — FLUCONAZOLE 150 MG PO TABS: ORAL_TABLET | ORAL | 0 refills | Status: DC

## 2021-10-13 NOTE — Progress Notes (Signed)
Chief Complaint  Patient presents with   Urinary Frequency    Cynthia Avery is a 25 y.o. female here for possible UTI.  Duration: 3 days. Symptoms: Dysuria, urinary frequency, hematuria, urinary hesitancy, and urgency Denies: urinary retention, fever, nausea, vomiting, and flank pain, vaginal discharge Hx of recurrent UTI? No Denies new sexual partners.  Past Medical History:  Diagnosis Date   No known health problems      BP 102/60 (BP Location: Left Arm, Patient Position: Sitting, Cuff Size: Normal)   Pulse 79   Temp 98.5 F (36.9 C) (Oral)   Ht 5\' 4"  (1.626 m)   Wt 121 lb (54.9 kg)   SpO2 99%   BMI 20.77 kg/m  General: Awake, alert, appears stated age Heart: RRR Lungs: CTAB, normal respiratory effort, no accessory muscle usage Abd: BS+, soft, NT, ND, no masses or organomegaly MSK: No CVA tenderness, neg Lloyd's sign Psych: Age appropriate judgment and insight  Dysuria - Plan: nitrofurantoin, macrocrystal-monohydrate, (MACROBID) 100 MG capsule, fluconazole (DIFLUCAN) 150 MG tablet, Urinalysis, Urine Culture  Ck urine. Symptoms similar to what she has had in past.  Stay hydrated. Seek immediate care if pt starts to develop fevers, new/worsening symptoms, uncontrollable N/V. F/u prn. The patient voiced understanding and agreement to the plan.  [place], DO 10/13/21 8:59 AM

## 2021-10-13 NOTE — Patient Instructions (Signed)
Stay hydrated.   Warning signs/symptoms: Uncontrollable nausea/vomiting, fevers, worsening symptoms despite treatment, confusion.  Give us around 2 business days to get culture back to you.  Let us know if you need anything. 

## 2021-10-15 LAB — URINE CULTURE
MICRO NUMBER:: 14018472
SPECIMEN QUALITY:: ADEQUATE

## 2021-10-17 ENCOUNTER — Ambulatory Visit: Payer: 59

## 2021-12-19 ENCOUNTER — Ambulatory Visit (INDEPENDENT_AMBULATORY_CARE_PROVIDER_SITE_OTHER): Payer: 59 | Admitting: Family Medicine

## 2021-12-19 ENCOUNTER — Encounter: Payer: Self-pay | Admitting: Family Medicine

## 2021-12-19 ENCOUNTER — Other Ambulatory Visit (HOSPITAL_COMMUNITY)
Admission: RE | Admit: 2021-12-19 | Discharge: 2021-12-19 | Disposition: A | Discharge status: Home / Self Care | Payer: 59 | Source: Ambulatory Visit | Attending: Family Medicine | Admitting: Family Medicine

## 2021-12-19 VITALS — BP 118/80 | HR 86 | Temp 98.0°F | Ht 64.0 in | Wt 121.0 lb

## 2021-12-19 DIAGNOSIS — R09A2 Foreign body sensation, throat: Secondary | ICD-10-CM

## 2021-12-19 DIAGNOSIS — Z114 Encounter for screening for human immunodeficiency virus [HIV]: Secondary | ICD-10-CM

## 2021-12-19 DIAGNOSIS — Z113 Encounter for screening for infections with a predominantly sexual mode of transmission: Secondary | ICD-10-CM | POA: Diagnosis present

## 2021-12-19 MED ORDER — FLUTICASONE PROPIONATE 50 MCG/ACT NA SUSP: 2.0000 | Freq: Every day | NASAL | 6 refills | Status: AC

## 2021-12-19 NOTE — Progress Notes (Signed)
Chief Complaint  Patient presents with   Follow-up    Throat lump, has reduced in size.    Exposure to STD    Subjective: Patient is a 26 y.o. female here for a lump in her throat.  Noticed it a few d ago. No pain. Feels more prominent when she takes bigger bites. Feels like it is going away when she belches. Only feels when she swallows. No trauma, fevers, illness, runny/stuffy nose. No burning in chest, abd pain, difficulty swallowing.   New sexual partner 2 weeks ago. No concerns from partner. Unprotected. She is on Depo injection for contraception.  Past Medical History:  Diagnosis Date   No known health problems     Objective: BP 118/80 (BP Location: Right Arm, Patient Position: Sitting, Cuff Size: Normal)   Pulse 86   Temp 98 F (36.7 C) (Oral)   Ht 5\' 4"  (1.626 m)   Wt 121 lb (54.9 kg)   SpO2 99%   BMI 20.77 kg/m  General: Awake, appears stated age HEENT: Nares patent, no rhinorrhea, clear mucus in pharynx without erythema Heart: RRR, no LE edema Lungs: CTAB, no rales, wheezes or rhonchi. No accessory muscle use Abd: BS+, S, NT, ND Psych: Age appropriate judgment and insight, normal affect and mood  Assessment and Plan: Globus sensation - Plan: fluticasone (FLONASE) 50 MCG/ACT nasal spray  Screening for HIV without presence of risk factors - Plan: HIV Antibody (routine testing w rflx)  Routine screening for STI (sexually transmitted infection) - Plan: Cervicovaginal ancillary only( [place])  Flonase. Consider PPI and ENT referral if no better in 2 weeks. Screen. Screen. Practice safe sex.  The patient voiced understanding and agreement to the plan.  [place], DO 12/19/21  9:18 AM

## 2021-12-19 NOTE — Patient Instructions (Signed)
Use the Flonase for 1-2 weeks. Let me know if we aren't turning the corner.   Give [place] 2-3 business days to get the results of your labs back.   Let us know if you need anything.

## 2021-12-20 ENCOUNTER — Encounter: Payer: Self-pay | Admitting: Family Medicine

## 2021-12-20 LAB — CERVICOVAGINAL ANCILLARY ONLY
Chlamydia: NEGATIVE
Comment: NEGATIVE
Comment: NEGATIVE
Comment: NORMAL
Neisseria Gonorrhea: NEGATIVE
Trichomonas: NEGATIVE

## 2021-12-20 LAB — HIV ANTIBODY (ROUTINE TESTING W REFLEX): HIV 1&2 Ab, 4th Generation: NONREACTIVE

## 2021-12-26 ENCOUNTER — Ambulatory Visit (INDEPENDENT_AMBULATORY_CARE_PROVIDER_SITE_OTHER): Payer: 59 | Admitting: *Deleted

## 2021-12-26 DIAGNOSIS — Z3042 Encounter for surveillance of injectable contraceptive: Secondary | ICD-10-CM | POA: Diagnosis not present

## 2021-12-26 MED ORDER — MEDROXYPROGESTERONE ACETATE 150 MG/ML IM SUSP
150.0000 mg | Freq: Once | INTRAMUSCULAR | Status: AC
  Administered 2021-12-26: 150 mg via INTRAMUSCULAR

## 2021-12-26 NOTE — Progress Notes (Signed)
Patient here for Depo Provera injection.  Patient is in her window (12/26/21-01/09/22) .   Injection given in right upper outer quadrant and patient tolerated well.  Next injection due between 03/14/22-3/20-24.

## 2022-03-06 IMAGING — CR DG CHEST 2V
2 series · 2 of 2 positions shown · non-contrast
Comparison: None.

CLINICAL DATA: Chest pain

EXAM:
CHEST - 2 VIEW

[w chest pa]
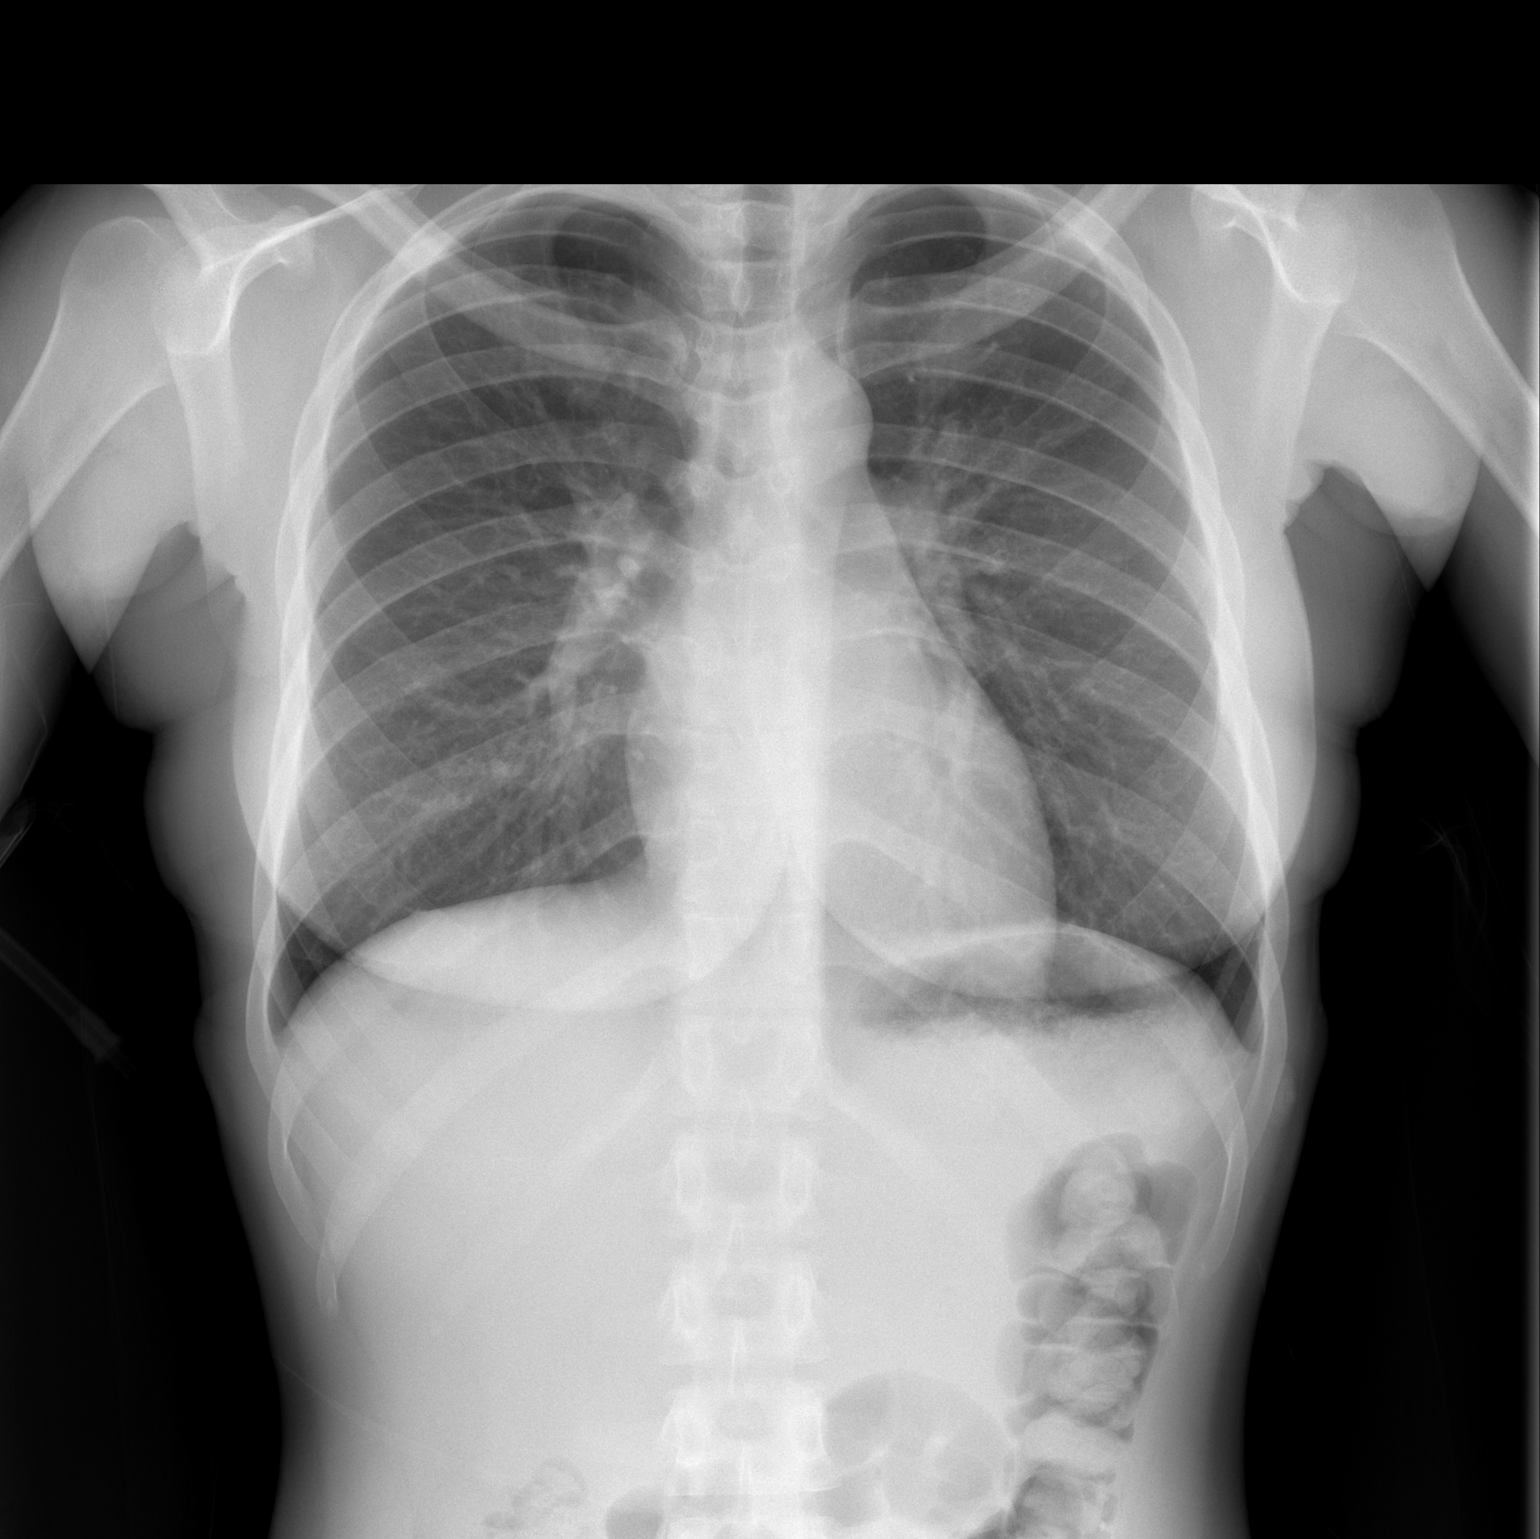

[w chest lat]
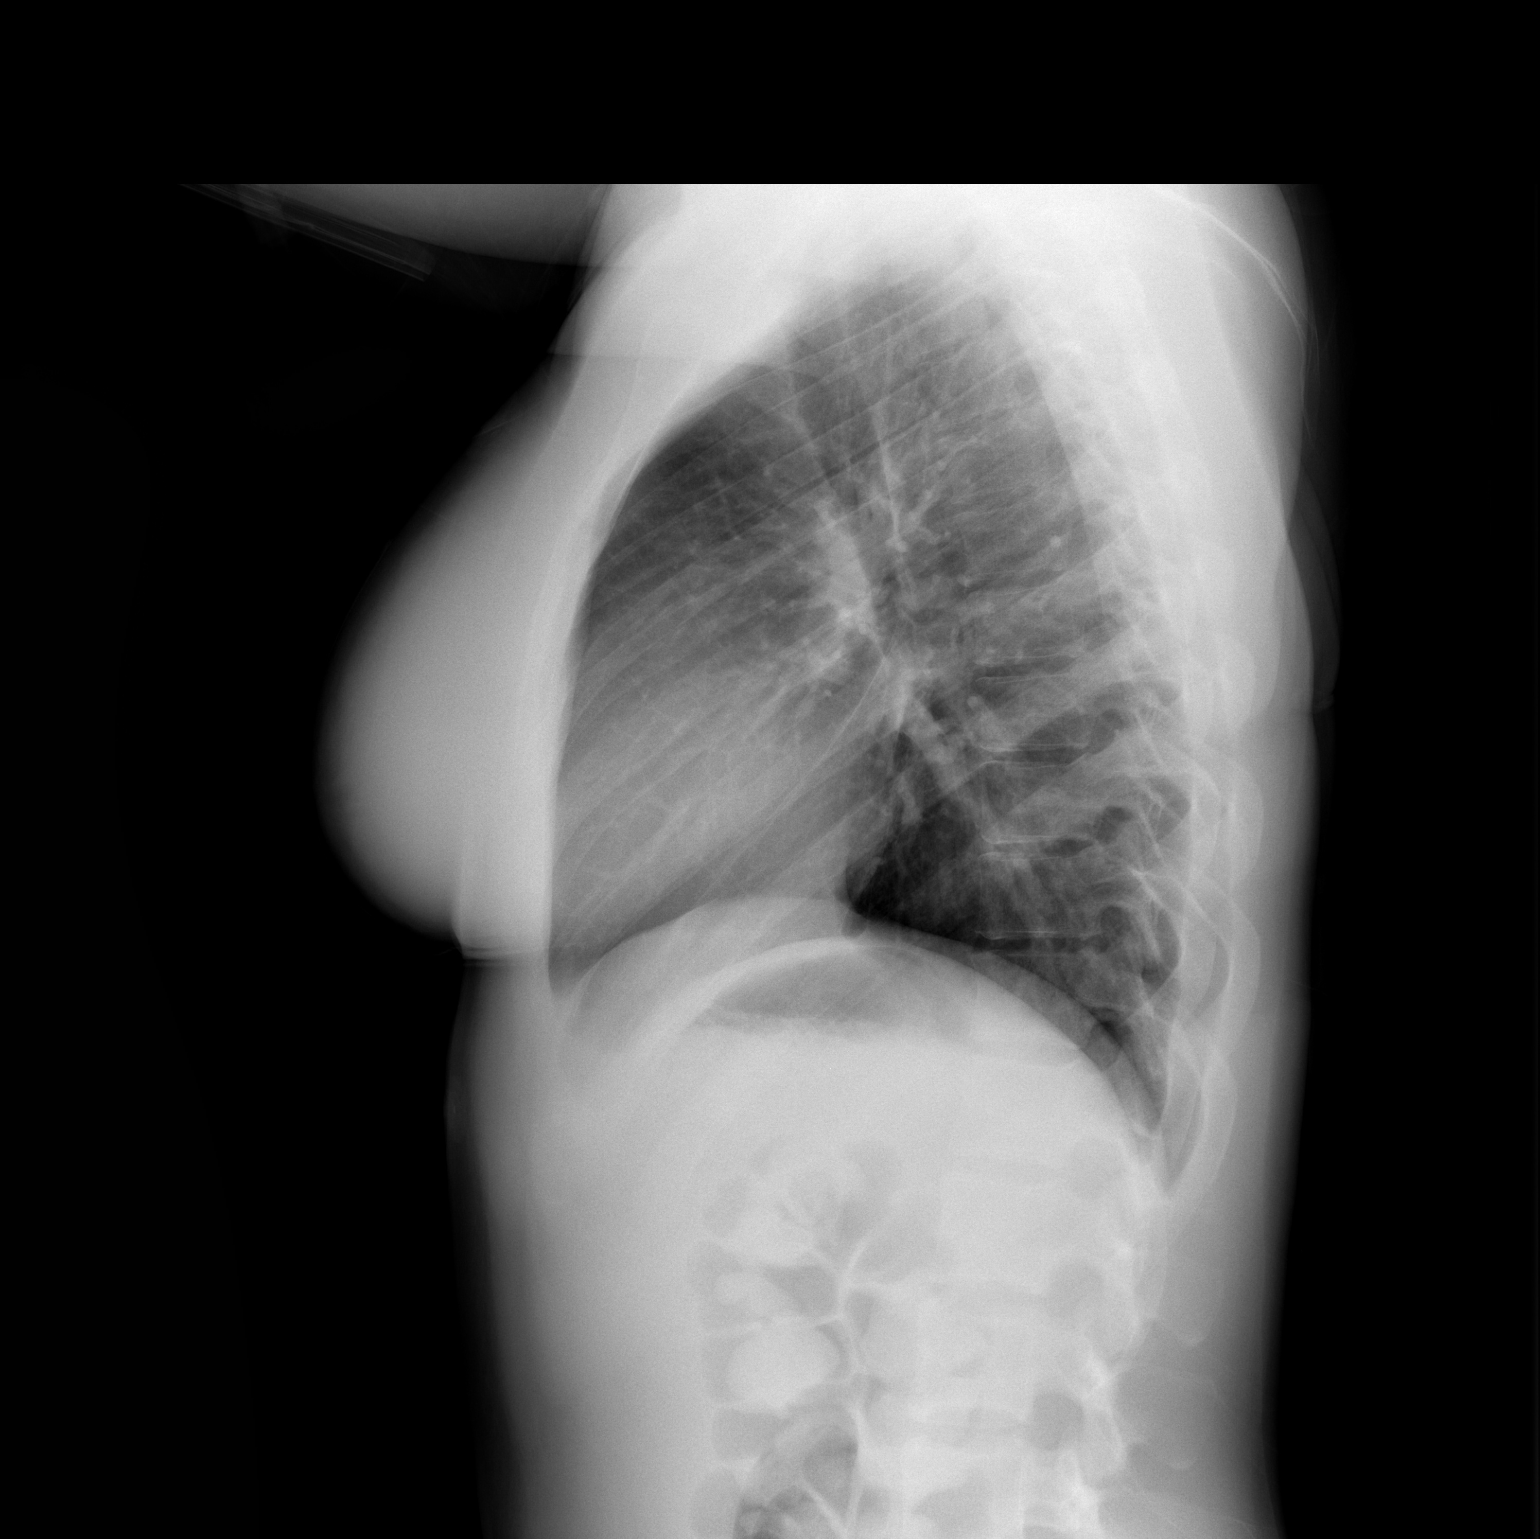

[2 of 2 positions shown; findings below may reference images not displayed]

FINDINGS: The heart size and mediastinal contours are within normal limits.
Both lungs are clear. The visualized skeletal structures are
unremarkable.
IMPRESSION: No active cardiopulmonary disease.

## 2022-03-08 IMAGING — RF DG ERCP WO/W SPHINCTEROTOMY
1 series · 6 of 6 positions shown · non-contrast
Comparison: MRI abdomen 08/27/2020

CLINICAL DATA: Abdominal pain

EXAM:
ERCP ERCP with sphincterotomy
TECHNIQUE: Multiple spot images obtained with the fluoroscopic device and
submitted for interpretation post-procedure.
FLUOROSCOPY TIME:  Fluoroscopy Time:  3 minutes 51 seconds
Radiation Exposure Index (if provided by the fluoroscopic device):
32.5 mGy
Number of Acquired Spot Images: 6

[Series 1: run · 6 of 6 slices shown]
[im 1/6]
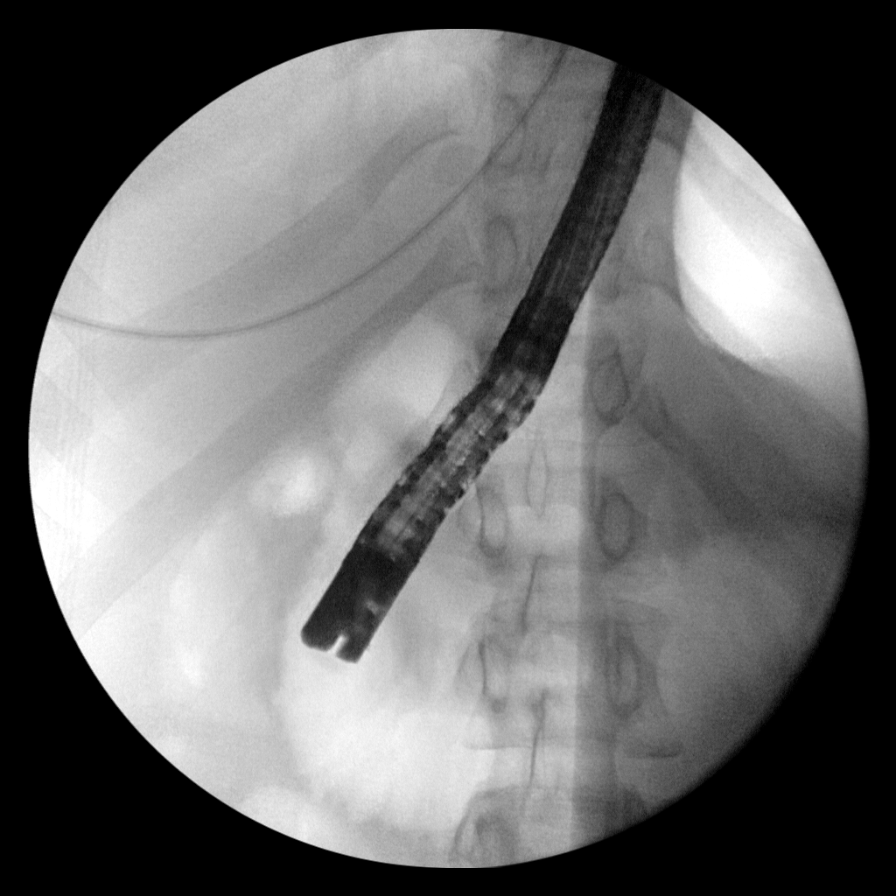
[im 2/6]
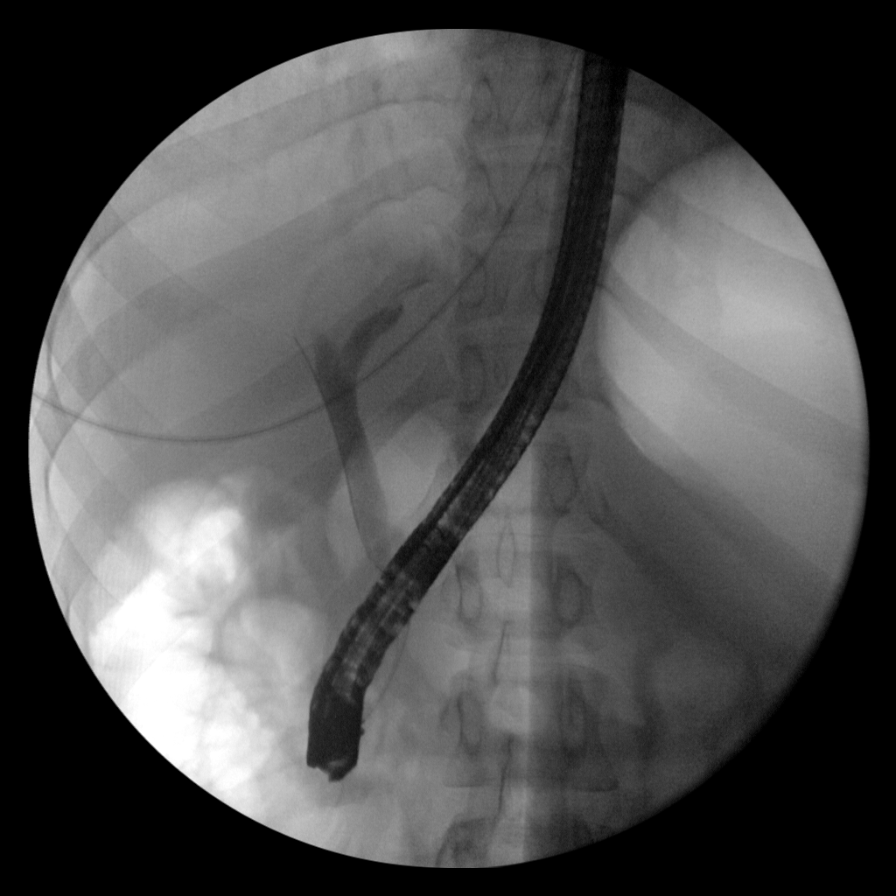
[im 3/6]
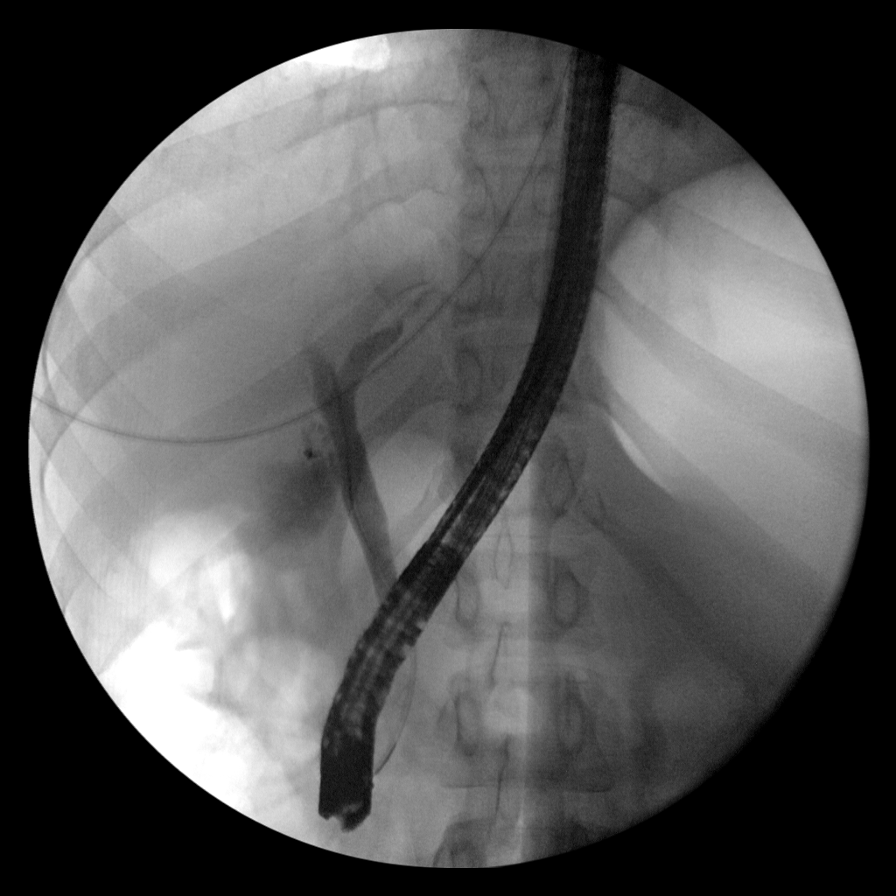
[im 4/6]
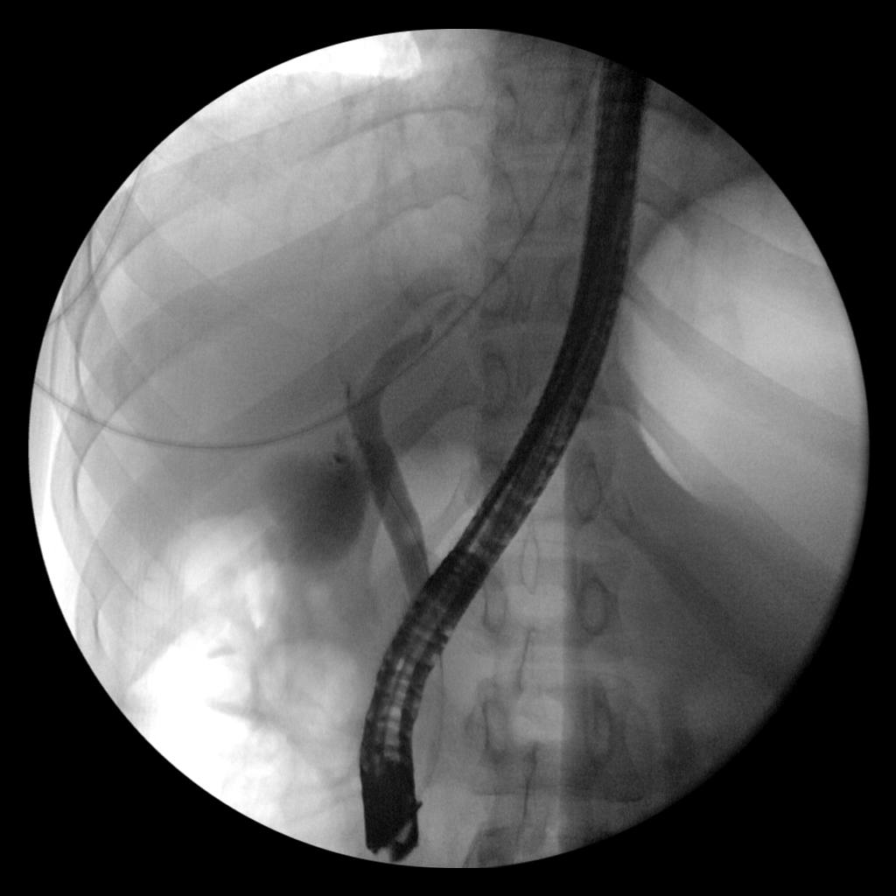
[im 5/6]
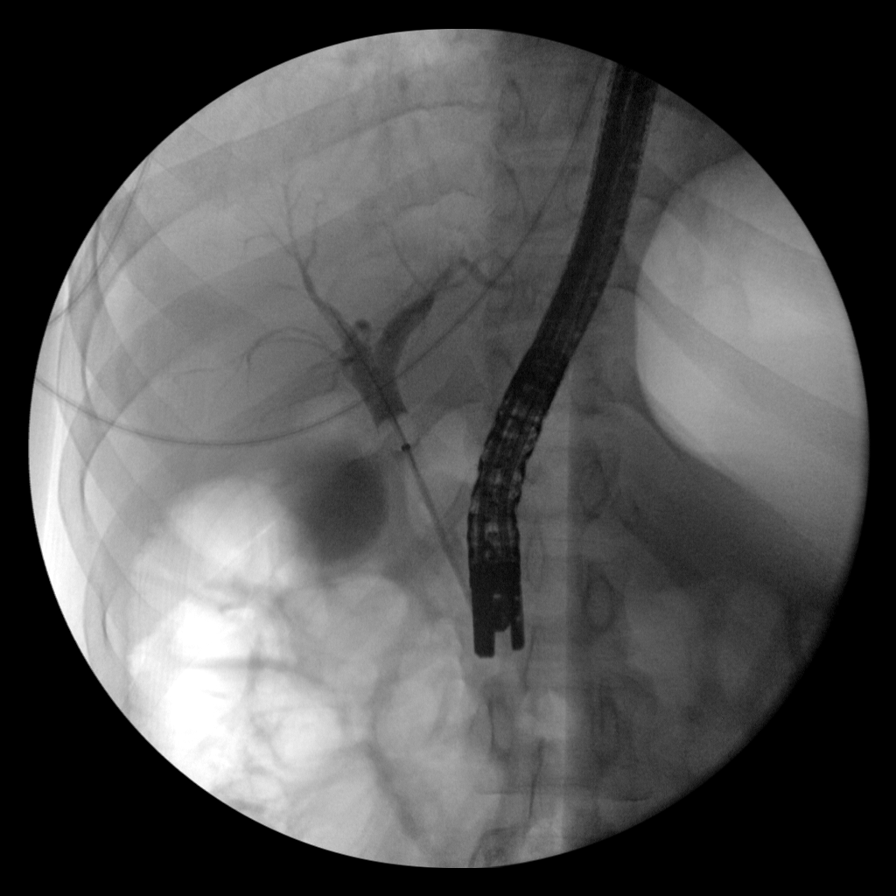
[im 6/6]
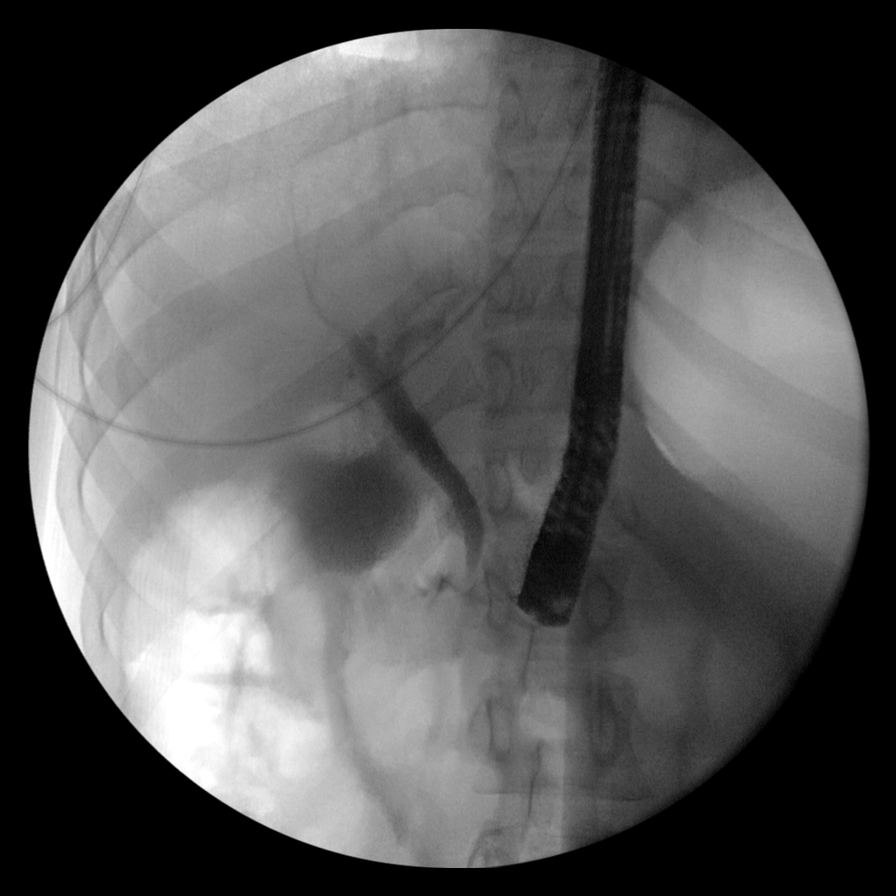

[6 of 6 positions shown; findings below may reference images not displayed]

FINDINGS: Submitted intraoperative fluoroscopic images demonstrate cannulation
and opacification of the common bile duct. There is a small
questionable filling defect in the mid common bile duct on image 4
which is not identified on the final image 6.
IMPRESSION: Intraoperative fluoroscopic images of ERCP as above.

These images were submitted for radiologic interpretation only.
Please see the procedural report for the amount of contrast and the
fluoroscopy time utilized.

## 2022-03-21 ENCOUNTER — Ambulatory Visit (INDEPENDENT_AMBULATORY_CARE_PROVIDER_SITE_OTHER): Payer: 59

## 2022-03-21 DIAGNOSIS — Z3009 Encounter for other general counseling and advice on contraception: Secondary | ICD-10-CM

## 2022-03-21 MED ORDER — MEDROXYPROGESTERONE ACETATE 150 MG/ML IM SUSY: PREFILLED_SYRINGE | Freq: Once | INTRAMUSCULAR | Status: AC

## 2022-03-21 NOTE — Progress Notes (Signed)
Patient here for Depo Provera injection.  Patient is in her window (03/14/22-03/28/22) .   Injection given in right upper outer quadrant and patient tolerated well.   Next injection due between 06/06/22-06/20/22

## 2022-06-06 ENCOUNTER — Ambulatory Visit (INDEPENDENT_AMBULATORY_CARE_PROVIDER_SITE_OTHER): Payer: 59

## 2022-06-06 DIAGNOSIS — Z3042 Encounter for surveillance of injectable contraceptive: Secondary | ICD-10-CM

## 2022-06-06 MED ORDER — MEDROXYPROGESTERONE ACETATE 150 MG/ML IM SUSP
150.0000 mg | INTRAMUSCULAR | Status: DC
  Administered 2022-06-06: 150 mg via INTRAMUSCULAR

## 2022-06-06 NOTE — Progress Notes (Signed)
Pt here for Depo Provera injection. Pt is within her window. Next window is between Aug 14th to Aug 28th.    Injection given in right upper outer quadrant and patient tolerated well. Next appointment scheduled

## 2022-06-12 ENCOUNTER — Ambulatory Visit: Payer: 59

## 2022-08-08 ENCOUNTER — Encounter (INDEPENDENT_AMBULATORY_CARE_PROVIDER_SITE_OTHER): Payer: Self-pay

## 2022-08-21 ENCOUNTER — Ambulatory Visit (INDEPENDENT_AMBULATORY_CARE_PROVIDER_SITE_OTHER): Payer: 59 | Admitting: Neurology

## 2022-08-21 DIAGNOSIS — Z3042 Encounter for surveillance of injectable contraceptive: Secondary | ICD-10-CM

## 2022-08-21 MED ORDER — MEDROXYPROGESTERONE ACETATE 150 MG/ML IM SUSP
150.0000 mg | INTRAMUSCULAR | Status: DC
  Administered 2022-08-21: 150 mg via INTRAMUSCULAR

## 2022-08-21 NOTE — Progress Notes (Signed)
Patient is here for Depo Provera injection. She is one day early in her window, but okay given per Dr. Carmelia Roller. Denies chest pain, shortness of breath, headaches, mood changes, or problems with medication.   Injection administered in the LUOQ. Patient tolerated injection well without complications. Patient scheduled next injection ion 11/09/2022 (window 11/06/2022-11/20/2022).

## 2022-08-22 ENCOUNTER — Ambulatory Visit: Payer: 59

## 2022-09-04 ENCOUNTER — Other Ambulatory Visit (HOSPITAL_COMMUNITY)
Admission: RE | Admit: 2022-09-04 | Discharge: 2022-09-04 | Disposition: A | Discharge status: Home / Self Care | Payer: 59 | Source: Ambulatory Visit | Attending: Family Medicine | Admitting: Family Medicine

## 2022-09-04 ENCOUNTER — Ambulatory Visit (INDEPENDENT_AMBULATORY_CARE_PROVIDER_SITE_OTHER): Payer: 59 | Admitting: Family Medicine

## 2022-09-04 VITALS — BP 120/71 | HR 77 | Temp 98.7°F | Ht 64.5 in | Wt 120.4 lb

## 2022-09-04 DIAGNOSIS — N898 Other specified noninflammatory disorders of vagina: Secondary | ICD-10-CM | POA: Diagnosis not present

## 2022-09-04 DIAGNOSIS — Z114 Encounter for screening for human immunodeficiency virus [HIV]: Secondary | ICD-10-CM

## 2022-09-04 MED ORDER — METRONIDAZOLE 500 MG PO TABS
500.0000 mg | ORAL_TABLET | Freq: Two times a day (BID) | ORAL | 0 refills | Status: AC
Start: 2022-09-04 — End: 2022-09-11

## 2022-09-04 NOTE — Progress Notes (Signed)
Chief Complaint  Patient presents with   Exposure to STD    Cynthia Avery is a 27 y.o. female here for vaginal discharge.  Duration: 1 week Description of discharge: yellow Odor: Yes New sexual partner: Yes Urinary complaints: No IUD? No Denies fevers, bleeding, pregnancy abdominal pain.  Past Medical History:  Diagnosis Date   No known health problems    Family History  Problem Relation Age of Onset   Cancer Neg Hx    Heart disease Neg Hx     BP 120/71 (BP Location: Left Arm, Patient Position: Sitting, Cuff Size: Normal)   Pulse 77   Temp 98.7 F (37.1 C) (Oral)   Ht 5' 4.5" (1.638 m)   Wt 120 lb 6 oz (54.6 kg)   SpO2 99%   BMI 20.34 kg/m  Gen: Awake, alert, appears stated age Heart: RRR Lungs: CTAB, no accessory muscle use Abd: BS+, soft, NT, ND, no masses or organomegaly GU: Deferred Psych: Age appropriate judgment and insight, nml mood and affect  Vaginal discharge - Plan: Cervicovaginal ancillary only( [place]), metroNIDAZOLE (FLAGYL) 500 MG tablet  Screening for HIV (human immunodeficiency virus) - Plan: HIV antibody (with reflex)  Orders as above.  Practice safe sex.  Empirically treat for BV with 7 days of Flagyl 500 mg twice daily.  Recommend she avoid alcohol while on this medication.  She is not pregnant. F/u as needed. Pt voiced understanding and agreement to the plan.  Jilda Roche [place], DO 09/04/22 3:31 PM

## 2022-09-05 LAB — HIV ANTIBODY (ROUTINE TESTING W REFLEX): HIV 1&2 Ab, 4th Generation: NONREACTIVE

## 2022-09-07 ENCOUNTER — Ambulatory Visit: Payer: 59 | Admitting: Family Medicine

## 2022-09-13 LAB — CERVICOVAGINAL ANCILLARY ONLY
Bacterial Vaginitis (gardnerella): NEGATIVE
Candida Glabrata: NEGATIVE
Candida Vaginitis: NEGATIVE
Chlamydia: NEGATIVE
Comment: NEGATIVE
Comment: NEGATIVE
Comment: NEGATIVE
Comment: NEGATIVE
Comment: NEGATIVE
Comment: NORMAL
Neisseria Gonorrhea: NEGATIVE
Trichomonas: POSITIVE — AB

## 2022-10-08 ENCOUNTER — Ambulatory Visit (INDEPENDENT_AMBULATORY_CARE_PROVIDER_SITE_OTHER): Payer: 59 | Admitting: Family Medicine

## 2022-10-08 ENCOUNTER — Other Ambulatory Visit (HOSPITAL_COMMUNITY)
Admission: RE | Admit: 2022-10-08 | Discharge: 2022-10-08 | Disposition: A | Discharge status: Home / Self Care | Payer: 59 | Source: Ambulatory Visit | Attending: Family Medicine | Admitting: Family Medicine

## 2022-10-08 ENCOUNTER — Encounter: Payer: Self-pay | Admitting: Family Medicine

## 2022-10-08 VITALS — BP 108/68 | HR 76 | Temp 98.7°F | Ht 64.0 in | Wt 119.2 lb

## 2022-10-08 DIAGNOSIS — Z113 Encounter for screening for infections with a predominantly sexual mode of transmission: Secondary | ICD-10-CM | POA: Insufficient documentation

## 2022-10-08 DIAGNOSIS — Z6820 Body mass index (BMI) 20.0-20.9, adult: Secondary | ICD-10-CM | POA: Diagnosis not present

## 2022-10-08 NOTE — Progress Notes (Signed)
Chief Complaint  Patient presents with   Follow-up    Lab testing today    Subjective: Patient is a 27 y.o. female here for STI screening.  Interested in being screened for STI's. No new sexual partners, pain, fevers, urinary complaints, dc. Tx'd for trich 1 mo ago. S/s's resolved after tx w Flagyl.   Past Medical History:  Diagnosis Date   No known health problems     Objective: BP 108/68 (BP Location: Left Arm, Patient Position: Sitting, Cuff Size: Normal)   Pulse 76   Temp 98.7 F (37.1 C) (Oral)   Ht 5\' 4"  (1.626 m)   Wt 119 lb 4 oz (54.1 kg)   SpO2 99%   BMI 20.47 kg/m  General: Awake, appears stated age Heart: RRR, no LE edema Lungs: CTAB, no rales, wheezes or rhonchi. No accessory muscle use Psych: Age appropriate judgment and insight, normal affect and mood  Assessment and Plan: Screening examination for STI - Plan: Cervicovaginal ancillary only( [place])  BMI 20.0-20.9, adult  Screen for trich, GC/chlamydia. Practice safe sex.  Discussed protein intake, macro nutrients, and tracking calories. Add wt resistance exercise as she wants to gain weight.  The patient voiced understanding and agreement to the plan.  I spent 21 min w the pt discussing the above plans in addition to reviewing her chart on the same day of the visit.   Jilda Roche [place], DO 10/08/22  8:58 AM

## 2022-10-08 NOTE — Patient Instructions (Addendum)
Give [place] 4-5 business days to get the results of your labs back.   Practice safe sex.   Start some weight resistance exercise and get it in 3 times per week.   Aim for 55+ g of protein daily to help build muscle mass.   Let us know if you need anything.

## 2022-10-09 LAB — CERVICOVAGINAL ANCILLARY ONLY
Chlamydia: NEGATIVE
Comment: NEGATIVE
Comment: NEGATIVE
Comment: NORMAL
Neisseria Gonorrhea: NEGATIVE
Trichomonas: NEGATIVE

## 2022-11-09 ENCOUNTER — Ambulatory Visit (INDEPENDENT_AMBULATORY_CARE_PROVIDER_SITE_OTHER): Payer: 59

## 2022-11-09 DIAGNOSIS — Z3042 Encounter for surveillance of injectable contraceptive: Secondary | ICD-10-CM | POA: Diagnosis not present

## 2022-11-09 MED ORDER — MEDROXYPROGESTERONE ACETATE 150 MG/ML IM SUSP
150.0000 mg | INTRAMUSCULAR | Status: DC
Start: 2022-11-09 — End: 2023-08-28
  Administered 2022-11-09: 150 mg via INTRAMUSCULAR

## 2022-11-09 NOTE — Progress Notes (Signed)
Date last pap: Sees Dr Michael Litter. Last Depo-Provera: 08/21/22. Side Effects if any: none. Serum HCG indicated? N/a. Depo-Provera 150 mg IM given by: Melton Alar, RMA. Next appointment due Jan 17- 31/ 2024. Depo Provera 150 mg (IM (route) was administered RUOQ today. Patient tolerated injection. Patient next appt made Yes  Creft, Melton Alar L

## 2023-01-29 ENCOUNTER — Ambulatory Visit (INDEPENDENT_AMBULATORY_CARE_PROVIDER_SITE_OTHER): Payer: 59

## 2023-01-29 ENCOUNTER — Telehealth: Payer: Self-pay

## 2023-01-29 DIAGNOSIS — Z3042 Encounter for surveillance of injectable contraceptive: Secondary | ICD-10-CM

## 2023-01-29 MED ORDER — MEDROXYPROGESTERONE ACETATE 150 MG/ML IM SUSP
150.0000 mg | INTRAMUSCULAR | Status: DC
Start: 2023-01-29 — End: 2023-08-28
  Administered 2023-01-29: 150 mg via INTRAMUSCULAR

## 2023-01-29 NOTE — Progress Notes (Addendum)
Last Depo-Provera: 11/09/22. Side Effects if any: none. Serum HCG indicated? N/a. Depo-Provera 150 mg IM given by: Wellington [place], RMA. Next appointment due 4/8 - 5/6.   Creft, Feliberto Harts

## 2023-01-29 NOTE — Telephone Encounter (Signed)
When in office for her Depo Provera pt stated that in the summer time she will be nearing the 2 year mark of receiving this injection.   She asks:  When she should switch? Is it really necessary? If so, how long does she have to be off of the shot? What should she switch to?  She is okay with an answer via MyChart.

## 2023-01-29 NOTE — Telephone Encounter (Signed)
Sent pt mychart message letting her know about Depo Provera shot and what Dr.Wendling  advise.

## 2023-01-29 NOTE — Telephone Encounter (Signed)
2 years as a cutoff.  Prolonged use can decrease bone density though after stopping it, it restores to normal.  In the meantime an oral contraceptive, Nexplanon device, or IUD is reasonable.  The switch could be made a week before she would be due for the next shot.

## 2023-03-21 ENCOUNTER — Encounter: Payer: Self-pay | Admitting: Family Medicine

## 2023-03-26 ENCOUNTER — Other Ambulatory Visit (HOSPITAL_COMMUNITY)
Admission: RE | Admit: 2023-03-26 | Discharge: 2023-03-26 | Disposition: A | Discharge status: Home / Self Care | Source: Ambulatory Visit | Attending: Family Medicine | Admitting: Family Medicine

## 2023-03-26 ENCOUNTER — Encounter: Payer: Self-pay | Admitting: Family Medicine

## 2023-03-26 ENCOUNTER — Ambulatory Visit (INDEPENDENT_AMBULATORY_CARE_PROVIDER_SITE_OTHER): Admitting: Family Medicine

## 2023-03-26 VITALS — BP 100/60 | HR 73 | Temp 97.8°F | Ht 64.0 in | Wt 115.4 lb

## 2023-03-26 DIAGNOSIS — Z113 Encounter for screening for infections with a predominantly sexual mode of transmission: Secondary | ICD-10-CM | POA: Diagnosis not present

## 2023-03-26 DIAGNOSIS — Z114 Encounter for screening for human immunodeficiency virus [HIV]: Secondary | ICD-10-CM

## 2023-03-26 DIAGNOSIS — N939 Abnormal uterine and vaginal bleeding, unspecified: Secondary | ICD-10-CM | POA: Diagnosis not present

## 2023-03-26 DIAGNOSIS — R634 Abnormal weight loss: Secondary | ICD-10-CM

## 2023-03-26 LAB — CBC
HCT: 39.2 % (ref 36.0–46.0)
Hemoglobin: 13 g/dL (ref 12.0–15.0)
MCHC: 33.3 g/dL (ref 30.0–36.0)
MCV: 97 fl (ref 78.0–100.0)
Platelets: 303 10*3/uL (ref 150.0–400.0)
RBC: 4.04 Mil/uL (ref 3.87–5.11)
RDW: 11.6 % (ref 11.5–15.5)
WBC: 3.2 10*3/uL — ABNORMAL LOW (ref 4.0–10.5)

## 2023-03-26 LAB — COMPREHENSIVE METABOLIC PANEL
ALT: 9 U/L (ref 0–35)
AST: 13 U/L (ref 0–37)
Albumin: 4.5 g/dL (ref 3.5–5.2)
Alkaline Phosphatase: 64 U/L (ref 39–117)
BUN: 11 mg/dL (ref 6–23)
CO2: 25 meq/L (ref 19–32)
Calcium: 9.7 mg/dL (ref 8.4–10.5)
Chloride: 105 meq/L (ref 96–112)
Creatinine, Ser: 0.83 mg/dL (ref 0.40–1.20)
GFR: 97.3 mL/min (ref >60.00)
Glucose, Bld: 85 mg/dL (ref 70–99)
Potassium: 4.4 meq/L (ref 3.5–5.1)
Sodium: 138 meq/L (ref 135–145)
Total Bilirubin: 1.9 mg/dL — ABNORMAL HIGH (ref 0.2–1.2)
Total Protein: 7.4 g/dL (ref 6.0–8.3)

## 2023-03-26 LAB — TSH: TSH: 1.27 u[IU]/mL (ref 0.35–5.50)

## 2023-03-26 NOTE — Progress Notes (Signed)
 Chief Complaint  Patient presents with   Acute Visit    Patient presents today for a STI screening.    Subjective: Patient is a 28 y.o. female here for abnormal uterine bleeding.  Patient has been on the Depo Provera injection every 3 months.  Her last injection was 01/29/2023.  Overall she does well.  Has had some spotting since becoming intimate with her partner.  Small amount of bleeding.  She has been using pantiliners.  She is not passing clots.  Patient like routine screening for STIs.  No concerns from her or her partner.  No symptoms.  She been losing weight over the past several years.  She reports a good appetite.  She does not exercise in excess or lift weights.  Past Medical History:  Diagnosis Date   No known health problems     Objective: BP 100/60   Pulse 73   Temp 97.8 F (36.6 C)   Ht 5\' 4"  (1.626 m)   Wt 115 lb 6.4 oz (52.3 kg)   SpO2 96%   BMI 19.81 kg/m  General: Awake, appears stated age Heart: RRR, no LE edema Lungs: CTAB, no rales, wheezes or rhonchi. No accessory muscle use Abdomen: Bowel sounds present, soft, nontender, nondistended Psych: Age appropriate judgment and insight, normal affect and mood  Assessment and Plan: Abnormal uterine bleeding (AUB)  Unintentional weight loss - Plan: Comprehensive metabolic panel, CBC, TSH  Screening for HIV without presence of risk factors - Plan: HIV Antibody (routine testing w rflx)  Screening examination for STI - Plan: Cervicovaginal ancillary only( [place])  Reassurance.  Offered some estrogen through an OCP which she politely declined. Check above labs.  Recommended she start lifting weights. Screen as above. Screen as above.  Practice safe sex. The patient voiced understanding and agreement to the plan.  Jilda Roche [place], DO 03/26/23  12:28 PM

## 2023-03-26 NOTE — Patient Instructions (Signed)
 Give [place] 2-3 business days to get the results of your labs back.   Practice safe sex.   Please consider adding some weight resistance exercise to your routine. Consider yoga as well.   Let us know if you need anything.

## 2023-03-27 LAB — CERVICOVAGINAL ANCILLARY ONLY
Chlamydia: NEGATIVE
Comment: NEGATIVE
Comment: NEGATIVE
Comment: NORMAL
Neisseria Gonorrhea: NEGATIVE
Trichomonas: NEGATIVE

## 2023-03-27 LAB — HIV ANTIBODY (ROUTINE TESTING W REFLEX): HIV 1&2 Ab, 4th Generation: NONREACTIVE

## 2023-04-17 ENCOUNTER — Ambulatory Visit: Payer: Medicaid Other

## 2023-08-23 ENCOUNTER — Encounter: Admitting: Family Medicine

## 2023-08-28 ENCOUNTER — Ambulatory Visit: Payer: Self-pay | Admitting: Family Medicine

## 2023-08-28 ENCOUNTER — Encounter: Payer: Self-pay | Admitting: Family Medicine

## 2023-08-28 ENCOUNTER — Ambulatory Visit (INDEPENDENT_AMBULATORY_CARE_PROVIDER_SITE_OTHER): Admitting: Family Medicine

## 2023-08-28 VITALS — BP 112/70 | HR 82 | Temp 98.0°F | Resp 16 | Ht 64.0 in | Wt 118.6 lb

## 2023-08-28 DIAGNOSIS — R55 Syncope and collapse: Secondary | ICD-10-CM | POA: Diagnosis not present

## 2023-08-28 DIAGNOSIS — Z13220 Encounter for screening for lipoid disorders: Secondary | ICD-10-CM

## 2023-08-28 DIAGNOSIS — Z0001 Encounter for general adult medical examination with abnormal findings: Secondary | ICD-10-CM

## 2023-08-28 DIAGNOSIS — Z0000 Encounter for general adult medical examination without abnormal findings: Secondary | ICD-10-CM

## 2023-08-28 LAB — CBC
HCT: 39.4 % (ref 36.0–46.0)
Hemoglobin: 13.1 g/dL (ref 12.0–15.0)
MCHC: 33.4 g/dL (ref 30.0–36.0)
MCV: 95.1 fl (ref 78.0–100.0)
Platelets: 310 K/uL (ref 150.0–400.0)
RBC: 4.14 Mil/uL (ref 3.87–5.11)
RDW: 11.2 % — ABNORMAL LOW (ref 11.5–15.5)
WBC: 3.7 K/uL — ABNORMAL LOW (ref 4.0–10.5)

## 2023-08-28 LAB — VITAMIN D 25 HYDROXY (VIT D DEFICIENCY, FRACTURES): VITD: 15.92 ng/mL — ABNORMAL LOW (ref 30.00–100.00)

## 2023-08-28 LAB — COMPREHENSIVE METABOLIC PANEL WITH GFR
ALT: 12 U/L (ref 0–35)
AST: 15 U/L (ref 0–37)
Albumin: 4.2 g/dL (ref 3.5–5.2)
Alkaline Phosphatase: 57 U/L (ref 39–117)
BUN: 10 mg/dL (ref 6–23)
CO2: 28 meq/L (ref 19–32)
Calcium: 9.3 mg/dL (ref 8.4–10.5)
Chloride: 105 meq/L (ref 96–112)
Creatinine, Ser: 0.75 mg/dL (ref 0.40–1.20)
GFR: 109.55 mL/min (ref >60.00)
Glucose, Bld: 90 mg/dL (ref 70–99)
Potassium: 4 meq/L (ref 3.5–5.1)
Sodium: 139 meq/L (ref 135–145)
Total Bilirubin: 1.5 mg/dL — ABNORMAL HIGH (ref 0.2–1.2)
Total Protein: 7.2 g/dL (ref 6.0–8.3)

## 2023-08-28 LAB — LIPID PANEL
Cholesterol: 144 mg/dL (ref 0–200)
HDL: 61.1 mg/dL (ref >39.00)
LDL Cholesterol: 74 mg/dL (ref 0–99)
NonHDL: 83.17
Total CHOL/HDL Ratio: 2
Triglycerides: 46 mg/dL (ref 0.0–149.0)
VLDL: 9.2 mg/dL (ref 0.0–40.0)

## 2023-08-28 LAB — TSH: TSH: 0.98 u[IU]/mL (ref 0.35–5.50)

## 2023-08-28 LAB — T4, FREE: Free T4: 0.95 ng/dL (ref 0.60–1.60)

## 2023-08-28 MED ORDER — VITAMIN D (ERGOCALCIFEROL) 1.25 MG (50000 UNIT) PO CAPS: 50000.0000 [IU] | ORAL_CAPSULE | ORAL | 0 refills | Status: AC

## 2023-08-28 NOTE — Patient Instructions (Addendum)
 Give us  2-3 business days to get the results of your labs back.   Try to drink 55-60 oz of water daily outside of exercise.  Call Center for [facility]: Lambert Rhonda W at [phone] for an appointment.  They are located at [address], [place], [place], [address] (right across the hall from our office).  Keep the diet clean and stay active.  Please get me a copy of your advanced directive form at your convenience.   I recommend getting the flu shot in mid October. This suggestion would change if the CDC comes out with a different recommendation.  If your symptoms return, please let me know.    Let us  know if you need anything.

## 2023-08-28 NOTE — Progress Notes (Signed)
 Chief Complaint  Patient presents with   Annual Exam    CPE     Well Woman Cynthia Avery is here for a complete physical.   Her last physical was >1 year ago.  Current diet: in general, a healthy diet. Current exercise: none. Fatigue out of ordinary? Not other than listed below Seatbelt? Yes Advanced directive? No  Health Maintenance Pap/HPV- Due Tetanus- Yes HIV screening- Yes Hep C screening- Yes  Dizziness Intermittent dizziness when she stands for period of time, usually at work. Happened around 2 weeks ago for a week straight. Lasts for a few minutes but feels low energy for the rest of the day. It did correlate with a short cycle (4 d). It would also happen when she was laying down or seated.   She gets hot and feels like she is going to pass out. She has not passed out. She has been having pelvic cramping since she came off of OCPs. She had some light bleeding starting 8/2. She is having nausea. No diarrhea, SOB, fevers, vomiting. She is not pregnant as of last week. She usually eats 1-2 hrs prior to her shifts. This is not related to physical exertion. Does not always stay hydrated. No consistent fluttering in chest. No drug use. No correlation w alcohol.   Past Medical History:  Diagnosis Date   No known health problems      Past Surgical History:  Procedure Laterality Date   CHOLECYSTECTOMY N/A 08/30/2020   Procedure: LAPAROSCOPIC CHOLECYSTECTOMY;  Surgeon: Signe Mitzie LABOR, MD;  Location: [facility];  Service: General;  Laterality: N/A;   ERCP N/A 08/28/2020   Procedure: ENDOSCOPIC RETROGRADE CHOLANGIOPANCREATOGRAPHY (ERCP);  Surgeon: Avram Lupita BRAVO, MD;  Location: [facility];  Service: Endoscopy;  Laterality: N/A;  balloon sweep no stones seen   SPHINCTEROTOMY  08/28/2020   Procedure: SPHINCTEROTOMY;  Surgeon: Avram Lupita BRAVO, MD;  Location: [facility];  Service: Endoscopy;;    Medications  Current Outpatient Medications on File Prior to Visit  Medication Sig  Dispense Refill   fluticasone  (FLONASE ) 50 MCG/ACT nasal spray Place 2 sprays into both nostrils daily. 16 g 6   Allergies No Known Allergies  Review of Systems: Constitutional:  no unexpected weight changes Eye:  no recent significant change in vision Ear/Nose/Mouth/Throat:  Ears:  no tinnitus or vertigo and no recent change in hearing Nose/Mouth/Throat:  no complaints of nasal congestion, no sore throat Cardiovascular: no chest pain Respiratory:  no cough and no shortness of breath Gastrointestinal:  no abdominal pain, no change in bowel habits GU:  Female: negative for dysuria or pelvic pain Musculoskeletal/Extremities:  no pain of the joints Integumentary (Skin/Breast):  no abnormal skin lesions reported Neurologic:  no headaches Endocrine:  denies fatigue Hematologic/Lymphatic:  No areas of easy bleeding  Exam BP 112/70 (BP Location: Left Arm, Patient Position: Sitting)   Pulse 82   Temp 98 F (36.7 C) (Oral)   Resp 16   Ht 5' 4 (1.626 m)   Wt 118 lb 9.6 oz (53.8 kg)   SpO2 95%   BMI 20.36 kg/m  General:  well developed, well nourished, in no apparent distress Skin:  no significant moles, warts, or growths Head:  no masses, lesions, or tenderness Eyes:  pupils equal and round, sclera anicteric without injection Ears:  canals without lesions, TMs shiny without retraction, no obvious effusion, no erythema Nose:  nares patent, mucosa normal, and no drainage  Throat/Pharynx:  lips and gingiva without lesion; tongue and uvula midline;  non-inflamed pharynx; no exudates or postnasal drainage Neck: neck supple without adenopathy, thyromegaly, or masses Lungs:  clear to auscultation, breath sounds equal bilaterally, no respiratory distress Cardio:  regular rate and rhythm, no bruits, no LE edema Abdomen:  abdomen soft, nontender; bowel sounds normal; no masses or organomegaly Genital: Defer to GYN Musculoskeletal:  symmetrical muscle groups noted without atrophy or  deformity Extremities:  no clubbing, cyanosis, or edema, no deformities, no skin discoloration Neuro:  gait normal; deep tendon reflexes normal and symmetric Psych: well oriented with normal range of affect and appropriate judgment/insight  Assessment and Plan  Well adult exam - Plan: CBC, Comprehensive metabolic panel with GFR, Lipid panel, VITAMIN D  25 Hydroxy (Vit-D Deficiency, Fractures)  Pre-syncope - Plan: TSH, T4, free   Well 28 y.o. female. Counseled on diet and exercise. GYN information provided in AVS. Advanced directive form provided today.  Pre-syncope: Sounds like vasovagal syncope with what she is describing.  She does need to increase her hydration.  Will check the above labs.  It has not happened for 2 weeks.  If it happens again, we will check a Zio patch for 2 weeks. Other orders as above. Follow up in 1 yr. The patient voiced understanding and agreement to the plan.  [name], DO 08/28/23 9:22 AM

## 2023-12-10 ENCOUNTER — Ambulatory Visit: Payer: Self-pay

## 2023-12-10 NOTE — Telephone Encounter (Signed)
 FYI Only or Action Required?: FYI only for provider: going to UC.  Patient was last seen in primary care on 08/28/2023 by Frann Mabel Mt, DO.  Called Nurse Triage reporting Dysuria.  Symptoms began yesterday.  Interventions attempted: Nothing.  Symptoms are: gradually worsening.  Triage Disposition: See Physician Within 24 Hours  Patient/caregiver understands and will follow disposition?: Yes  Reason for Disposition  All other patients with painful urination  (Exception: [1] EITHER frequency or urgency AND [2] has on-call doctor.)  Answer Assessment - Initial Assessment Questions Pt reports pelvic discomfort at end of urination, increased frequency since yesterday. Also reports urine is pink tinged when she wipes. Also noted thick, white discharge but believes it to be her normal. Denies chance of pregnancy.  Pt had chills yesterday, but did not take temperature. Has resolved.   No same day OV available, pt agreeable to going to UC in order to not delay care.   3. PATTERN: Is pain present every time you urinate or just sometimes?      With each urination 4. ONSET: When did the painful urination start?      Yesterday 5. FEVER: Do you have a fever? If Yes, ask: What is your temperature, how was it measured, and when did it start?     Had chills, resolved 6. PAST UTI: Have you had a urine infection before? If Yes, ask: When was the last time? and What happened that time?      Yes, no recent  7. CAUSE: What do you think is causing the painful urination?  (e.g., UTI, scratch, Herpes sore)     UTI 8. OTHER SYMPTOMS: Do you have any other symptoms? (e.g., blood in urine, flank pain, genital sores, urgency, vaginal discharge)     White, thick vaginal discharge that she believes is her normal 9. PREGNANCY: Is there any chance you are pregnant? When was your last menstrual period?     Denies  Protocols used: Urination Pain - Female-A-AH Copied from CRM  [id number]. Topic: Clinical - Red Word Triage >> Dec 10, 2023  1:35 PM Cynthia Avery wrote: Cynthia Avery that prompted transfer to Nurse Triage: Possible UTI severe pain and bleeding app for tomorrow.

## 2023-12-10 NOTE — Telephone Encounter (Signed)
FYI. Pt going to UC.

## 2023-12-11 ENCOUNTER — Ambulatory Visit: Admitting: Family Medicine

## 2023-12-18 ENCOUNTER — Encounter: Payer: Self-pay | Admitting: Family Medicine

## 2023-12-19 NOTE — Telephone Encounter (Signed)
 Tried calling Pt to see if she can come tomorrow 12/12 at 1145, lmom asking for call back or informed she can send a mychart msg.

## 2023-12-23 ENCOUNTER — Encounter: Payer: Self-pay | Admitting: Family Medicine

## 2023-12-23 ENCOUNTER — Other Ambulatory Visit (HOSPITAL_COMMUNITY): Admission: RE | Admit: 2023-12-23 | Discharge: 2023-12-23 | Disposition: A | Source: Ambulatory Visit

## 2023-12-23 ENCOUNTER — Ambulatory Visit: Payer: Self-pay | Admitting: Family Medicine

## 2023-12-23 ENCOUNTER — Ambulatory Visit: Admitting: Family Medicine

## 2023-12-23 VITALS — BP 110/76 | HR 77 | Temp 98.0°F | Resp 16 | Ht 64.0 in | Wt 117.6 lb

## 2023-12-23 DIAGNOSIS — R8290 Unspecified abnormal findings in urine: Secondary | ICD-10-CM

## 2023-12-23 DIAGNOSIS — B3731 Acute candidiasis of vulva and vagina: Secondary | ICD-10-CM

## 2023-12-23 LAB — POC URINALSYSI DIPSTICK (AUTOMATED)
Bilirubin, UA: NEGATIVE
Glucose, UA: NEGATIVE
Ketones, UA: NEGATIVE
Leukocytes, UA: NEGATIVE
Nitrite, UA: NEGATIVE
Protein, UA: NEGATIVE
Spec Grav, UA: <=1.005 — AB (ref 1.010–1.025)
Urobilinogen, UA: 0.2 U/dL
pH, UA: 7.5 (ref 5.0–8.0)

## 2023-12-23 LAB — MICROALBUMIN / CREATININE URINE RATIO
Creatinine,U: 91.1 mg/dL
Microalb Creat Ratio: UNDETERMINED mg/g (ref 0.0–30.0)
Microalb, Ur: <0.7 mg/dL

## 2023-12-23 MED ORDER — FLUCONAZOLE 150 MG PO TABS: ORAL_TABLET | ORAL | 0 refills | Status: AC

## 2023-12-23 NOTE — Progress Notes (Signed)
 Chief Complaint  Patient presents with   Vaginitis    Yeast     Cynthia Avery is a 28 y.o. female here for vaginal discharge.  Duration: 2 weeks Description of discharge: thick and white Odor: No New sexual partner: No Urinary complaints: No Denies fevers pregnancy, abdominal pain, N/V. Took place after round of Cefdinir.   Past Medical History:  Diagnosis Date   No known health problems    Family History  Problem Relation Age of Onset   Cancer Neg Hx    Heart disease Neg Hx     BP 110/76 (BP Location: Left Arm, Patient Position: Sitting)   Pulse 77   Temp 98 F (36.7 C) (Oral)   Resp 16   Ht 5' 4 (1.626 m)   Wt 117 lb 9.6 oz (53.3 kg)   SpO2 96%   BMI 20.19 kg/m  Gen: Awake, alert, appears stated age Heart: RRR Lungs: CTAB, no accessory muscle use Abd: BS+, soft, NT, ND, no masses or organomegaly GU: defer to GYN Psych: Age appropriate judgment and insight, nml mood and affect  Yeast vaginitis - Plan: fluconazole  (DIFLUCAN ) 150 MG tablet, Cervicovaginal ancillary only( [place]), Urine Culture, CANCELED: Cervicovaginal ancillary only( [place])  Urine abnormality - Plan: POCT Urinalysis Dipstick (Automated), Urine Microalbumin w/creat. ratio  Ck swab. Diflucan  150 mg, repeat in 72 hrs if no better.  F/u prn. Pt voiced understanding and agreement to the plan.  Mabel Mt [place], DO 12/23/2023 12:18 PM

## 2023-12-23 NOTE — Patient Instructions (Signed)
 Give us  4-5 business days to get the results of your labs back.   Stay hydrated.  Let us  know if you need anything.

## 2023-12-24 LAB — CERVICOVAGINAL ANCILLARY ONLY
Bacterial Vaginitis (gardnerella): POSITIVE — AB
Candida Glabrata: NEGATIVE
Candida Vaginitis: POSITIVE — AB
Comment: NEGATIVE
Comment: NEGATIVE
Comment: NEGATIVE
Comment: NEGATIVE
Trichomonas: NEGATIVE

## 2023-12-24 LAB — URINE CULTURE
MICRO NUMBER:: 17356170
Result:: NO GROWTH
SPECIMEN QUALITY:: ADEQUATE

## 2023-12-24 MED ORDER — METRONIDAZOLE 500 MG PO TABS: 500.0000 mg | ORAL_TABLET | Freq: Two times a day (BID) | ORAL | 0 refills | Status: AC

## 2024-01-14 ENCOUNTER — Ambulatory Visit

## 2024-01-14 ENCOUNTER — Other Ambulatory Visit (HOSPITAL_COMMUNITY): Admission: RE | Admit: 2024-01-14 | Discharge: 2024-01-14 | Disposition: A | Source: Ambulatory Visit

## 2024-01-14 VITALS — BP 105/65 | HR 72 | Ht 64.0 in | Wt 116.1 lb

## 2024-01-14 DIAGNOSIS — N898 Other specified noninflammatory disorders of vagina: Secondary | ICD-10-CM

## 2024-01-14 DIAGNOSIS — Z1239 Encounter for other screening for malignant neoplasm of breast: Secondary | ICD-10-CM | POA: Diagnosis not present

## 2024-01-14 DIAGNOSIS — Z01419 Encounter for gynecological examination (general) (routine) without abnormal findings: Secondary | ICD-10-CM | POA: Diagnosis present

## 2024-01-14 DIAGNOSIS — Z113 Encounter for screening for infections with a predominantly sexual mode of transmission: Secondary | ICD-10-CM | POA: Diagnosis present

## 2024-01-14 DIAGNOSIS — N90810 Female genital mutilation status, unspecified: Secondary | ICD-10-CM

## 2024-01-14 DIAGNOSIS — N939 Abnormal uterine and vaginal bleeding, unspecified: Secondary | ICD-10-CM | POA: Diagnosis not present

## 2024-01-14 DIAGNOSIS — Z124 Encounter for screening for malignant neoplasm of cervix: Secondary | ICD-10-CM

## 2024-01-14 MED ORDER — PREPLUS 27-1 MG PO TABS: 1.0000 | ORAL_TABLET | Freq: Every day | ORAL | 13 refills | Status: AC

## 2024-01-14 NOTE — Progress Notes (Unsigned)
 "   GYNECOLOGY OFFICE VISIT NOTE-WELL WOMAN EXAM  History:    Cynthia Avery is a 29 year old here today for annual exam. She reports some concern with menstrual cycles since discontinuing depo provera .   Birth Control:  Depo in the past-Discontinued ~ Jan 2025 after 18 months.  She reports no menses during that time of usage. She does not desire BC currently and does not desire pregnancy.   Reproductive Concerns Sexually Active:Yes- No pain or discomfort Partners Type: Female-Occasional Condom usage Number of partners in last year: One STD Testing: GC/CT  Obstetrical History: G1P1001 -Vaginal. No complications during pregnancy or delivery.  Gynecological History: No h/o abnormal paps. Vaginal/GU Concerns: She reports periods twice monthly currently. She states it is light flow lasting 3-4 days. She reports some cramping that occurs 1-2 days. She does not require interventions. She states the flow is consistent across cycles.   Breast Concerns/Exams: She denies breast exams, but endorses SBA. She denies family history of breast, uterine, cervical, or ovarian cancer  Medical and Nutrition PCP: Frann Mt Significant PMx: Denies Exercise: Hiking-3 miles weekly. Tobacco/Drugs/Alcohol/Vaping: Vaping, Alcohol Wine & Spirits (2 drinks weekly) Nutrition: Endorses balanced nutritional intake.   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 DV/A:  Denies Social Support: Endorses Employment: Scientist, Research (medical)  Past Medical History:  Diagnosis Date   No known health problems     Past Surgical History:  Procedure Laterality Date   CHOLECYSTECTOMY N/A 08/30/2020   Procedure: LAPAROSCOPIC CHOLECYSTECTOMY;  Surgeon: Signe Mitzie LABOR, MD;  Location: [facility];  Service: General;  Laterality: N/A;   ERCP N/A 08/28/2020   Procedure: ENDOSCOPIC RETROGRADE CHOLANGIOPANCREATOGRAPHY (ERCP);  Surgeon: Avram Lupita BRAVO, MD;  Location: [facility];  Service: Endoscopy;  Laterality:  N/A;  balloon sweep no stones seen   SPHINCTEROTOMY  08/28/2020   Procedure: SPHINCTEROTOMY;  Surgeon: Avram Lupita BRAVO, MD;  Location: [facility];  Service: Endoscopy;;    The following portions of the patient's history were reviewed and updated as appropriate: allergies, current medications, past family history, past medical history, past social history, past surgical history and problem list.   Health Maintenance: Pap: Oct 2021 Date,  NILM Results.  Mammogram: N/A.  Colonoscopy: N/A     12/23/2023   10:50 AM 08/28/2023    8:53 AM 03/26/2023    8:39 AM 12/19/2021    7:51 AM 09/15/2021    2:43 PM  Depression screen PHQ 2/9  Decreased Interest 0 1 0 0 0  Down, Depressed, Hopeless 0 0 0 0 0  PHQ - 2 Score 0 1 0 0 0  Altered sleeping 0 1  0 0  Tired, decreased energy 0 1  0 0  Change in appetite 0 0  0 0  Feeling bad or failure about yourself  0 0  0 0  Trouble concentrating 0 0  0 0  Moving slowly or fidgety/restless 0 0  0 0  Suicidal thoughts 0 0  0 0  PHQ-9 Score 0 3   0  0   Difficult doing work/chores Not difficult at all Not difficult at all  Not difficult at all Not difficult at all     Data saved with a previous flowsheet row definition     Review of Systems:  Pertinent items noted in HPI and remainder of comprehensive ROS otherwise negative.    Objective:     Physical Exam BP 105/65 (BP Location: Left Arm, Patient Position: Sitting, Cuff Size: Normal)  Pulse 72   Ht 5' 4 (1.626 m)   Wt 116 lb 1.9 oz (52.7 kg)   LMP 01/04/2024 (Exact Date)   BMI 19.93 kg/m  Physical Exam Constitutional:      Appearance: Normal appearance.  Genitourinary:     Genitourinary Comments: CBE completed and normal.  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 Vaginal vault pink with good rugae.  Moderate amt white discharge. No apparent odor. Cervix pink and without lesions. Pap collected with broom. BME performed with no uterine enlargement noted. Mild  tenderness on right side with deep palpation. Adnexa not palpated bilaterally.      Right Labia: No tenderness or lesions.    Left Labia: No tenderness or lesions.    Vaginal discharge present.     No cervical friability, lesion or polyp.     Uterus is not enlarged or tender.  HENT:     Head: Normocephalic and atraumatic.  Eyes:     Conjunctiva/sclera: Conjunctivae normal.  Cardiovascular:     Rate and Rhythm: Normal rate and regular rhythm.     Heart sounds: Normal heart sounds.  Pulmonary:     Effort: Pulmonary effort is normal.     Breath sounds: Normal breath sounds.  Abdominal:     General: Bowel sounds are normal. There is no distension.     Palpations: Abdomen is soft.     Tenderness: There is no abdominal tenderness.     Hernia: No hernia is present.  Musculoskeletal:        General: Normal range of motion.     Cervical back: Normal range of motion.  Neurological:     Mental Status: She is alert and oriented to person, place, and time.  Skin:    General: Skin is warm and dry.  Psychiatric:        Mood and Affect: Mood normal.        Behavior: Behavior normal.  Vitals reviewed. Exam conducted with a chaperone present [place], [place]).     Labs and Imaging No results found for this or any previous visit (from the past week). No results found.    Assessment & Plan:  29 year old Female Well Woman Exam with Pap Smear STD Testing Breast Exam  1. Well woman exam with routine gynecological exam -Exam performed and findings discussed. -Encouraged to activate and utilize Mychart for reviewing of results, communication with office, and scheduling of appts. -Discussed heart disease risk factors. Educated on AHA exercise recommendations of 30 minutes of moderate to vigorous activity at least 5x/week.   2. Pap smear for cervical cancer screening -Pap completed. -Educated on ASCCP guidelines regarding pap smear evaluation and frequency. -Informed of turnover time and  provider/clinic policy on releasing results.  3. Abnormal uterine bleeding (AUB) -Reviewed changes in bleeding patterns associated with hormones.  -Encouraged to monitor bleeding patterns and inform provider if worsening.  -Discussed typical treatment with hormones if desired. Patient declines currently. -STD screening recommended to r/o and patient agreeable.   4. Encounter for screening breast examination -CBE completed. -Discussed initiation of mammograms at age 24 or earlier as needed.  -Educated and encouraged to perform SBE with increased breast awareness including examination of breast for skin changes, moles, tenderness, etc.   5.  Routine screening for STI (sexually transmitted infection) -CV collected. -Discussed discharge noted and will also test for BV/Yeast to rule out. Patient agreeable.  -Will treat accordingly.   Routine preventative health maintenance measures emphasized. Please refer  to After Visit Summary for other counseling recommendations.   No follow-ups on file.      Harlene LITTIE Duncans, CNM 01/14/2024      "

## 2024-01-15 LAB — CERVICOVAGINAL ANCILLARY ONLY
Bacterial Vaginitis (gardnerella): NEGATIVE
Candida Glabrata: NEGATIVE
Candida Vaginitis: NEGATIVE
Chlamydia: NEGATIVE
Comment: NEGATIVE
Comment: NEGATIVE
Comment: NEGATIVE
Comment: NEGATIVE
Comment: NEGATIVE
Comment: NORMAL
Neisseria Gonorrhea: NEGATIVE
Trichomonas: NEGATIVE

## 2024-01-17 LAB — CYTOLOGY - PAP
Comment: NEGATIVE
Diagnosis: UNDETERMINED — AB
High risk HPV: POSITIVE — AB

## 2024-01-21 ENCOUNTER — Ambulatory Visit: Payer: Self-pay

## 2024-02-10 ENCOUNTER — Encounter: Admitting: Obstetrics and Gynecology

## 2024-03-02 ENCOUNTER — Encounter: Admitting: Obstetrics and Gynecology
